# Patient Record
Sex: Female | Born: 1935
Health system: Southern US, Community
[De-identification: ages and names within clinical notes are randomized; demographics above are authoritative.]

## PROBLEM LIST (undated history)

## (undated) DIAGNOSIS — E119 Type 2 diabetes mellitus without complications: Secondary | ICD-10-CM

## (undated) DIAGNOSIS — E785 Hyperlipidemia, unspecified: Secondary | ICD-10-CM

## (undated) DIAGNOSIS — I2510 Atherosclerotic heart disease of native coronary artery without angina pectoris: Secondary | ICD-10-CM

## (undated) DIAGNOSIS — I10 Essential (primary) hypertension: Secondary | ICD-10-CM

## (undated) DIAGNOSIS — G510 Bell's palsy: Secondary | ICD-10-CM

## (undated) HISTORY — DX: Essential (primary) hypertension: I10

## (undated) HISTORY — DX: Type 2 diabetes mellitus without complications: E11.9

## (undated) HISTORY — DX: Bell's palsy: G51.0

## (undated) HISTORY — DX: Atherosclerotic heart disease of native coronary artery without angina pectoris: I25.10

## (undated) HISTORY — DX: Hyperlipidemia, unspecified: E78.5

---

## 1998-12-05 ENCOUNTER — Encounter: Payer: Self-pay | Admitting: Family Medicine

## 1998-12-05 ENCOUNTER — Ambulatory Visit (HOSPITAL_COMMUNITY): Admission: RE | Admit: 1998-12-05 | Discharge: 1998-12-05 | Payer: Self-pay | Admitting: Family Medicine

## 1999-01-16 ENCOUNTER — Encounter: Admission: RE | Admit: 1999-01-16 | Discharge: 1999-01-30 | Payer: Self-pay | Admitting: Orthopedic Surgery

## 1999-10-09 ENCOUNTER — Encounter: Payer: Self-pay | Admitting: Family Medicine

## 1999-10-10 ENCOUNTER — Inpatient Hospital Stay (HOSPITAL_COMMUNITY): Admission: AD | Admit: 1999-10-10 | Discharge: 1999-10-12 | Payer: Self-pay | Admitting: Family Medicine

## 1999-10-10 HISTORY — PX: CARDIAC CATHETERIZATION: SHX172

## 2000-03-19 ENCOUNTER — Encounter: Payer: Self-pay | Admitting: Emergency Medicine

## 2000-03-19 ENCOUNTER — Inpatient Hospital Stay (HOSPITAL_COMMUNITY): Admission: EM | Admit: 2000-03-19 | Discharge: 2000-03-22 | Payer: Self-pay | Admitting: Emergency Medicine

## 2000-03-20 ENCOUNTER — Encounter: Payer: Self-pay | Admitting: Family Medicine

## 2000-08-11 ENCOUNTER — Ambulatory Visit (HOSPITAL_COMMUNITY): Admission: RE | Admit: 2000-08-11 | Discharge: 2000-08-12 | Payer: Self-pay | Admitting: Cardiology

## 2000-08-11 HISTORY — PX: CARDIAC CATHETERIZATION: SHX172

## 2001-05-20 ENCOUNTER — Encounter: Admission: RE | Admit: 2001-05-20 | Discharge: 2001-08-18 | Payer: Self-pay | Admitting: Family Medicine

## 2001-10-11 ENCOUNTER — Encounter: Payer: Self-pay | Admitting: Orthopedic Surgery

## 2001-10-13 ENCOUNTER — Inpatient Hospital Stay (HOSPITAL_COMMUNITY): Admission: RE | Admit: 2001-10-13 | Discharge: 2001-10-15 | Payer: Self-pay | Admitting: Orthopedic Surgery

## 2001-12-14 ENCOUNTER — Encounter: Admission: RE | Admit: 2001-12-14 | Discharge: 2002-03-14 | Payer: Self-pay | Admitting: Orthopedic Surgery

## 2002-04-06 ENCOUNTER — Inpatient Hospital Stay (HOSPITAL_COMMUNITY): Admission: RE | Admit: 2002-04-06 | Discharge: 2002-04-07 | Payer: Self-pay | Admitting: Orthopedic Surgery

## 2005-08-11 ENCOUNTER — Ambulatory Visit (HOSPITAL_COMMUNITY): Admission: RE | Admit: 2005-08-11 | Discharge: 2005-08-11 | Payer: Self-pay | Admitting: Rehabilitation

## 2005-08-18 ENCOUNTER — Inpatient Hospital Stay (HOSPITAL_COMMUNITY): Admission: RE | Admit: 2005-08-18 | Discharge: 2005-08-19 | Payer: Self-pay | Admitting: Cardiology

## 2005-09-17 ENCOUNTER — Encounter: Admission: RE | Admit: 2005-09-17 | Discharge: 2005-12-16 | Payer: Self-pay | Admitting: Family Medicine

## 2005-10-30 ENCOUNTER — Encounter: Admission: RE | Admit: 2005-10-30 | Discharge: 2006-01-28 | Payer: Self-pay | Admitting: Family Medicine

## 2006-06-23 HISTORY — PX: CARDIOVASCULAR STRESS TEST: SHX262

## 2008-06-02 ENCOUNTER — Encounter: Admission: RE | Admit: 2008-06-02 | Discharge: 2008-06-02 | Payer: Self-pay | Admitting: Family Medicine

## 2010-07-12 HISTORY — PX: OTHER SURGICAL HISTORY: SHX169

## 2010-12-05 ENCOUNTER — Ambulatory Visit
Admission: RE | Admit: 2010-12-05 | Discharge: 2010-12-05 | Disposition: A | Discharge status: Home / Self Care | Payer: Medicare HMO | Source: Ambulatory Visit | Attending: Orthopedic Surgery | Admitting: Orthopedic Surgery

## 2010-12-05 ENCOUNTER — Other Ambulatory Visit: Payer: Self-pay | Admitting: Orthopedic Surgery

## 2010-12-05 DIAGNOSIS — R52 Pain, unspecified: Secondary | ICD-10-CM

## 2011-01-31 NOTE — Op Note (Signed)
[place]. [facility]  Patient:    Debra Acosta, Debra Acosta                   MRN: [id number] Proc. Date: 08/11/00 Adm. Date:  [id number] Attending:  Ophelia Shoulder CC:         Geraldo Pitter, M.D.  Cath Lab   Operative Report  PROCEDURE: 1. Percutaneous cutting balloon angioplasty of the second obtuse marginal    branch of the left circumflex coronary artery for in-stent restenosis. 2. Selective coronary angiography by Judkins technique. 3. Retrograde left heart catheterization. 4. Left ventricular angiography.  COMPLICATIONS:  None.  ENTRY SITE:  Right femoral.  DYE USED:  Omnipaque for diagnostic catheterization and Hexabrix for percutaneous coronary intervention.  MEDICATIONS GIVEN:  IV Versed, IV fentanyl for sedation, IV heparin and IV ReoPro.  INDICATIONS:  The patient is a 77 year old African-American female who had an inferior myocardial infarction in January of 2001 and the culprit vessel was found to be a lesion in the circumflex and obtuse marginal branch #2 of the circumflex.  That lesion underwent stenting by Runell Gess, M.D. on October 10, 1999, and the lesion of 99% was reduced to 0% residual after a 3.0 x 15 mm [id number] stent was placed.  The patient began having effort angina several weeks ago and when seen in the [place] office of [facility], was scheduled for this cardiac catheterization.  The patient wished to defer on having it done until after Thanksgiving.  This procedure was performed on an outpatient basis electively.  RESULTS:  The left ventricular pressure was 150/23, central aortic pressure 150/85, mean of 110.  No aortic valve gradient by pullback technique.  ANGIOGRAPHIC RESULTS:  The left main coronary artery showed some irregularities at the ostium, but no significant lesions.  The LAD coursed to the cardiac apex and gave rise to one major diagonal branch. There was a 60%  lesion just before the first diagonal branch arose in the proximal LAD.  This was a short segment and a fairly concentric lesion.  The left circumflex was large and dominant giving rise to a first obtuse marginal branch, a second large obtuse marginal branch, and then a posterior descending branch.  There was a radioopaque stent after the origin of the first obtuse marginal and the stent was involved with the circumflex itself and then a second obtuse marginal branch.  The ostium of the posterior descending was hazy, but was felt not to be significantly stenosed.  There was a distal PDA lesion of approximately 50%.  The right coronary artery was small and nondominant and contained no significant disease.  The left ventricle contained a fairly discrete old inferior infarct in the diaphragmatic wall segment.  It was fairly discrete and well contained and fairly akinetic.  The remaining wall segments moved vigorously.  The ejection fraction estimated at 45%.  No mitral regurgitation is seen.  PERCUTANEOUS CORONARY INTERVENTION:  This intervention was performed with 7 French introducer sheath in the right femoral artery with 7 [place] guide catheter which was a Voda left 4.0.  It provided excellent backup support in the left main coronary artery.  The guide wire used was a 300 cm long, Patriot guide wire.  I tried a 3.0 x 15 mm cutting balloon as my initial device to recannalize the stenotic stent, but it would not cross the lesion.  I therefore tried a 2.5 x 15 mm Maverick balloon which easily crossed  the lesion and was inflated to approximately 8 atmospheres of pressure and this allowed easy passage of the 3.0 cutting balloon.  Several cutting balloon inflations were performed followed by a 3.25 x 10 mm cutting balloon and finally a 3.5 x 15 mm cutting balloon.  I inflated it to 9 atmospheres of pressure and the result looked very good.  The 85% stenosis was reduced to 10% residual and  there was Timi 3 distal flow.  The patient tolerated the procedure very well.  Each time balloon inflation occurred, ST segment changes occurred in leads 1 and 2, but the patients pain was minimal.  IV nitroglycerin was used during the case to manage blood pressures which elevated sometimes up into the 160s and 170s.  FINAL DIAGNOSES: 1. In-stent restenosis of the second obtuse marginal branch of the circumflex    coronary artery. 2. Successful cutting balloon angioplasty of the in-stent restenosis with    reduction of the lesion from 80% to 10%. 3. 60% proximal left anterior descending stenosis and 50% posterior descending    branch stenosis. 4. Mild left ventricular dysfunction with ejection fraction at 45% with an    akinetic inferior wall.  RECOMMENDATIONS:  Plavix for a month.  Beta blocker, aspirin, and statin drug therapy. DD:  08/11/00 TD:  08/11/00 Job: [id number]

## 2011-01-31 NOTE — H&P (Signed)
   NAME:  Debra Acosta, Debra Acosta                   ACCOUNT NO.:  [id number]   MEDICAL RECORD NO.:  [id number]                   PATIENT TYPE:  INP   LOCATION:  5022                                 FACILITY:  [facility]   PHYSICIAN:  Kennieth Rad, M.D.              DATE OF BIRTH:  [birth year]   DATE OF ADMISSION:  04/05/2002  DATE OF DISCHARGE:  04/07/2002                                HISTORY & PHYSICAL   CHIEF COMPLAINT:  Weakness in left shoulder.   HISTORY OF PRESENT ILLNESS:  This is a 77 year old female with rotator cuff  tear of the left shoulder.  The patient had previous repair of the left  rotator cuff several months ago, but has had re-tear of the same rotator  cuff.  The patient has done fairly well with physical therapy, but developed  progressive weakness in the left shoulder.  The patient returned presently  for repair of left rotator cuff with use of Restore mesh gauze.   PAST MEDICAL HISTORY:  Shoulder repair, bilateral tubal ligation,  bunionectomy, history of high blood pressure, and hypercholesterolemia.   ALLERGIES:  PENICILLIN, COZAAR.   MEDICATIONS:  Zocor, Celebrex, Lasix, clonidine, Claritin, Rhinocort,  Proventil, and Diskus puffs one puff daily inhaler, Atacand.   SOCIAL HISTORY:  Habits:  None.   FAMILY HISTORY:  Noncontributory.   REVIEW OF SYSTEMS:  Some shortness of breath.  No urinary or bowel symptoms.  No chest pain.   PHYSICAL EXAMINATION:  VITAL SIGNS:  Temperature 97, pulse 87, respirations  16, blood pressure 193/105.  CBG 162.  Height 5 feet 5 inches, weight 315.  GENERAL:  Extremely obese.  HEENT:  Normocephalic.  Eyes:  Conjunctivae and sclerae clear.  NECK:  Supple.  CHEST:  Clear.  CARDIAC:  S1, S2 regular.  EXTREMITIES:  Left shoulder:  The patient is able to abduct the left  shoulder up to abduction 75 degrees, full extension 85 degrees, __________.  Range of motion limited.   IMPRESSION:  Rotator cuff tear, left shoulder.                                               Kennieth Rad, M.D.    AFC/MEDQ  D:  05/03/2002  T:  05/03/2002  Job:  [id number]

## 2011-01-31 NOTE — Discharge Summary (Signed)
[place]. [facility]  Patient:    Debra Acosta                    MRN: [id number] Adm. Date:  [id number] Disc. Date: [id number] Attending:  Beverely Low Dictator:   Pincus Badder, P.A.                           Discharge Summary  ADMISSION DIAGNOSES: 1. Chest pain, rule out myocardial infarction. 2. Hypertension. 3. Chronic obstructive pulmonary disease. 4. Angina.  DISCHARGE DIAGNOSES: 1. Inferior myocardial infarction. 2. Hypertension. 3. Chronic obstructive pulmonary disease.  CONSULTATION: 1. Dr. Madaline Savage. 2. Dr. Runell Gess.  PROCEDURE:  Coronary catheterization and PTCA with stent placement.  HISTORY OF PRESENT ILLNESS:  This patient is a 77 year old female who reported o the office complaining of chest pain and shortness of breath.  She felt like a heavy chest pain the night before.  This has awakened her from sleep.  She has ad no nausea, no vomiting, no diaphoresis.  She did have some jaw discomfort.  The  pain lasted for several hours, and felt like a heavy pressure.  PAST MEDICAL HISTORY:  She is currently being treated for hypertension, chronic  obstructive pulmonary disease, obesity.  Other medical history reveals she had  tubal ligation and a left bunionectomy in the past.  CURRENT MEDICATIONS: 1. Vioxx. 2. ______ 3. Cardizem. 4. Effexor. 5. Singulair.  She is sometimes noncompliant with her medications.  ALLERGIES:  PENICILLIN AND COZAAR.  PHYSICAL EXAMINATION:  Her blood pressure is 154/90, weight 308 pounds, pulse 88, respirations 18.  LABORATORY DATA:  Showed a sodium of 140, potassium 3.9, chloride 103, CO2 of 29, glucose 111, BUN 16, creatinine 0.9.  Calcium 9.1, total protein 6.7, albumin 3.2, AST 28, ALT 21, ___ 108, total bilirubin 0.7.  CPK elevated at 232, CPK-MB 11.6, relative index 5.0, troponin I of 0.28.  White blood cells 4.4, hemoglobin 13.2, hematocrit 40.3.  PT 15.7, PTT  greater than 200, INR 1.5.  Her electrocardiogram showed T-wave abnormality and also it looks like a previous inferior ischemia with septal infarction, age undetermined.  HOSPITAL COURSE:  We had decided to admit her to the cardiac floor and do a cardiac consultation and start appropriate fluids and medications, and monitor her heart function.  A cardiac consultation done by Dr. Elsie Lincoln revealed a possible myocardial infarction, unstable angina, and it was decided that she should undergo a cardiac catheterization to rule out any stenosis.  The cardiac catheterization did show the left circumflex artery with a 99% stenosis.  She also had involvement in the bifurcation of the second marginal and the PABG/PDA branch.  The left anterior descending coronary artery had a 50%-60% segmental stenosis.  The left main artery had a 20%-30% ostial stenosis.  Her ejection fraction was between 40%-45% with moderate inferobasal hypokinesia.  Therefore she underwent a stent placement which was performed by Dr. Allyson Sabal.  She tolerated the procedure well and following the  procedure had no further chest pain or shortness of breath.  The blood pressure  stabilized and had come down to 104/41.  Electrocardiogram showed a normal sinus rhythm.  Her rehabilitation was started on hospital day one with cardiac rehabilitation.  She initially felt very weak, but as they continued to work with  her, she did start to improve and feel better.  She did very well and had no complaints by  October 11, 1999.  DISPOSITION:  The next day it was decided that she could be discharged home to er family, and followed on an outpatient basis.  CONDITION ON DISCHARGE:  Improved.  DISCHARGE MEDICATIONS: 1. New medications:  Effexor XR 75 mg one q.d. 2. Altace 2.5 mg b.i.d. 3. Vioxx 50 mg q.d. 4. Cardura 8 mg q.d. 5. Plavix 75 mg q.d. for four weeks. 6. Lipitor 10 mg q.d. 7. Enteric-coated aspirin 325 mg q.d. 8.  Singulair 10 mg q.d. 9. Verapamil SR 240 mg q.d.  ACTIVITIES:  Restricted.  No strenuous activity.  No driving until seen in followup.  DIET:  A low-fat diet.  FOLLOWUP:  With Dr. Elsie Lincoln in two weeks, and with Dr. Geraldo Pitter in 10 days. DD:  10/16/99 TD:  10/16/99 Job: [id number]

## 2011-01-31 NOTE — Discharge Summary (Signed)
Debra Acosta, Debra Acosta            ACCOUNT NO.:  [id number]   MEDICAL RECORD NO.:  [id number]          PATIENT TYPE:  INP   LOCATION:  6525                         FACILITY:  [facility]   PHYSICIAN:  Madaline Savage, M.D.DATE OF BIRTH:  [birth year]   DATE OF ADMISSION:  08/18/2005  DATE OF DISCHARGE:  08/19/2005                                 DISCHARGE SUMMARY   DISCHARGE DIAGNOSIS:  1.  Coronary artery disease status post intervention during this admission      with new stent proximal left anterior descending, Cypher stent, and      percutaneous transluminal angiography to mid left circumflex.  2.  Left ventricular  dysfunction with ejection fraction 45%.  3.  Hypertension.  4.  Hyperlipidemia.  5.  Obesity.   HISTORY OF PRESENT ILLNESS:  This is a 77 year old African American female  with prior history of coronary artery disease and intervention in 2001 who  presented to our office in November 2006 with complaints of chest discomfort  and EKG abnormalities with abnormal Cardiolite stress test suggestive of  interval development of worsening disease in the LAD territory.  The patient  was scheduled for cardiac catheterization and was admitted to [facility] to the short stay unit on August 18, 2005.   PROCEDURE PERFORMED:  Cardiac catheterization with intervention performed by  Dr. Elsie Lincoln on August 18, 2005, coronary angiography revealed new lesion in  the LAD with 75% stenosis and lesion in the mid left circumflex of 75%.  Placement of a Cypher stent in the LAD was done with reduction of lesion  from 75% to 0% and, also, an attempt was made to stent the left circumflex,  but he was unable to cross that lesion with the Cypher stent or with a mini-  Vision stent and angioplasty was performed to that lesion with reduction  from 75% to 20%.   HOSPITAL COURSE:  The patient tolerated the procedure well.  The next  morning, she was assessed by Dr. Allyson Sabal and was found to be in  stable  condition for discharge home.   LABORATORY DATA:  CBC revealed hemoglobin 11.6, hematocrit 35, white blood  cell count 4, platelets 163.  BMP showed potassium 4.2, sodium 140, chloride  105, CO2 28, BUN 11, creatinine 1.2, glucose 125.   DISCHARGE INSTRUCTIONS:  No lifting greater than 5 pounds, no driving for  three days, no strenuous activities for three days.  Discharge diet low fat,  low cholesterol diet.   DISCHARGE MEDICATIONS:  Atacand 25 mg daily, Clonidine 0.1 mg b.i.d.,  Vytorin 10/80 mg daily, Actos with Metformin 15/500, 2 p.o. daily, may start  on August 21, 2005, Advair Discus inhaler daily, Plavix 75 mg daily,  aspirin 81 mg daily.   FOLLOW UP:  Dr. Elsie Lincoln will see the patient in our office on September 02, 2005, at 2 p.m.      Debra Acosta, P.A.    ______________________________  Madaline Savage, M.D.    MK/MEDQ  D:  08/19/2005  T:  08/19/2005  Job:  [id number]   cc:   Renaye Rakers, M.D.  Fax: [phone]

## 2011-01-31 NOTE — Op Note (Signed)
[place]. [facility]  Patient:    Debra Acosta, Debra Acosta Visit Number: [id number] MRN: [id number]          Service Type: SUR Location: 5000 [id number] Attending Physician:  Wende Mott Dictated by:   Kennieth Rad, M.D. Proc. Date: 10/13/01 Admit Date:  10/13/2001 Discharge Date: 10/15/2001                             Operative Report  PREOPERATIVE DIAGNOSES: 1. Rotator cuff tear, left shoulder. 2. Impingement left shoulder.  POSTOPERATIVE DIAGNOSES: 1. Rotator cuff tear, left shoulder. 2. Impingement left shoulder.  OPERATION: 1. Arthroscopic acromioplasty and decompression. 2. Open rotator cuff repair with use of two suture anchors.  SURGEON:  Kennieth Rad, M.D.  ANESTHESIA:  General.  DESCRIPTION OF PROCEDURE:  The patient was taken to the operating room after given adequate prior medication and given general anesthesia and intubated. The patient was placed in barber chair position.  The left shoulder was prepped with DuraPrep and draped in sterile manner.  Puncture wound, 1/2 inch, was made posteriorly, Swisher rod was placed from posterior to anterior.  In flow was anterior.  Lateral separate incision was made.  Visualization with the arthroscope revealed tremendously hypertrophic subacromial bursa sac, complete disruption of the rotator cuff.  There was a very large tear and chondromalacia changes on the subacromial space.  With the use of the shaver, synovectomy was done removing the subacromial bursa sac followed by acromioplasty.  A shoulder strap incision was made going through the skin and subcutaneous tissue down to the deltoid and this was reflected distally. Identification of the large tear was made.  Heavy Tycron suture was initially placed more proximal inside the rotator cuff bringing the cuff tendons together and reducing the size of the separation.  Sharp and blunt dissection was done to free and undermine and  free up the cuff tendons.  Once the defect was brought down to a smaller defect, two suture anchors were placed into the greater tuberosity and sutured into anterior and posterior and lateral cuff sections.  Small trough was made at the base with an osteotome and the cuff tendon was buried down into the trough by way of the suture anchors.  Copious irrigation was done with antibiotic solution.  Wound closure was then done with the use of 0 Vicryl for the deltoid, 2-0 for the subcutaneous and skin staples for the skin.  Compressive dressing and immobilizer shoulder brace was applied.  The patient tolerated the procedure quite well and went to the recovery room in stable and satisfactory condition. Dictated by:   Kennieth Rad, M.D. Attending Physician:  Wende Mott DD:  11/06/01 TD:  11/06/01 Job: [id number]

## 2011-01-31 NOTE — H&P (Signed)
[place]. [facility]  Patient:    Debra Acosta                    MRN: [id number] Adm. Date:  [id number] Attending:  Beverely Low Dictator:   Pincus Badder, P.A.                         History and Physical  CHIEF COMPLAINT:  Chest pain, shortness of breath.  HISTORY OF PRESENT ILLNESS:  A 77 year old female who reported shortness of breath and chest pain, felt like a heavy chest pain the night before and it had awoken her from her sleep.  She had no nausea, no vomiting, no diaphoresis, but she also did have some jaw discomfort.  This pain lasted for several hours and mostly felt like a heavy pressure.  ALLERGIES: 1. PENICILLIN. 2. COZAAR.  CURRENT MEDICATIONS: 1. Vioxx 50 mg a day. 2. Covera HS 240 mg a day. 3. Cardizem 4 mg a day. 4. Effexor 75 mg a day. 5. Singulair 10 mg a day.  PAST MEDICAL HISTORY:  She has a history of hypertension.  She has COPD and obesity.  She has a history of a tubal ligation and left bunionectomy.  FAMILY HISTORY:  She has one sister with mild coronary artery disease and a mother had hypertension.  She is deceased now.  SOCIAL HISTORY:  She is married, nine children.  She stopped tobacco about 23 years ago.  No exercise.  REVIEW OF SYSTEMS:  Weight is increased.  She has some chest pain and shortness of breath.  ENT:  Negative.  GI:  Negative.  Neurologic:  She is alert and oriented.  PHYSICAL EXAMINATION:  VITAL SIGNS:  Blood pressure 154/90, weight 308 pounds, pulse 88, respirations 8.  GENERAL:  This is a 77 year old black female.  She is in mild distress at present but in good spirits.  HEENT:  Negative.  NECK:  Supple.  No lymphadenopathy.  Trachea midline.  CHEST:  She has some chest tenderness to the anterior chest cavity.  BREASTS:  Negative.  HEART:  She has a regular rate.  No S3, no S4.  No JVD.  ABDOMEN:  Obese.  Difficult to exam.  GENITOURINARY:   Negative.  EXTREMITIES:  She has full range of motion.  No joint deformities.  SKIN:  Warm and dry.  LABORATORY DATA:  Sodium 140, potassium 3.9, chloride 103, CO2 29, glucose 111, BUN 16, creatinine 0.9, calcium 9.1, total protein 6.7, albumin 3.2, AST 28, ALT 21, ALP 108, total bilirubin 0.7.  CK is elevated at 232, CK-MB 11.6, relative index 5.0, troponin-I 0.28.  White blood cells 4.4, hemoglobin 13.2, hematocrit 40.3. PT 15.7, PTT greater than 200, INR 1.5.  Her electrocardiogram showed a normal sinus rhythm and it also showed a T wave abnormality considered a previous inferior ischemia and a septal infarct, age undetermined.  ASSESSMENT:  Chest pain, rule out myocardial infarction.  Appears to have unstable angina, hypertensive, chronic obstructive pulmonary disease.  PLAN:  The plan is to admit the patient to cardiac floor.  We will do a cardiac  consult, start appropriate fluids and medications, and to monitor her heart function. DD:  10/11/99 TD:  10/11/99 Job: [id number]

## 2011-01-31 NOTE — H&P (Signed)
[place]. [facility]  Patient:    ANGLE, DIRUSSO Visit Number: [id number] MRN: [id number]          Service Type: SUR Location: 5000 [id number] Attending Physician:  Wende Mott Dictated by:   Kennieth Rad, M.D. Admit Date:  10/13/2001 Discharge Date: 10/15/2001                           History and Physical  CHIEF COMPLAINT:  Painful and weak left shoulder.  HISTORY OF PRESENT ILLNESS:  This is a 77 year old female who has been having difficulty with her left shoulder after a fall she had last year.  The patients pain continued to reoccur.  The patient fell in October of last year.  The patient complained of inability to lift the left arm up.  PAST MEDICAL HISTORY:  Left foot surgery and tubal ligation.  Also history of high blood pressure, asthma, hypercholesterolemia, and diabetes mellitus.  ALLERGIES:  PENICILLIN, CORTISONE, and COZAAR.  HABITS:  None.  FAMILY HISTORY:  Noncontributory.  REVIEW OF SYSTEMS:  Episodes of coughing from bronchitis.  No cardiac symptoms, no urinary symptoms.  No bowel symptoms.  PHYSICAL EXAMINATION:  VITAL SIGNS:  Temperature 98.1.  Pulse 82, respirations 18, blood pressure 174/88.  Height 5 feet 5-1/2 inches.  Weight 310 pounds.  HEENT:  Normocephalic, atraumatic.  Sclera clear.  NECK:  Supple.  CHEST:  Clear.  CARDIAC:  S1, S2 regular.  EXTREMITIES:  Left shoulder tender anterior and laterally.  The patient has very minimal ability to abduct the left arm.  Subacromial crepitus.  Pain on rotation and abduction.  IMPRESSION: 1. Rotator cuff tear left shoulder. 2. High blood pressure. 3. Diabetes mellitus. 4. Chronic obstructive pulmonary disease. 5. Hypercholesterolemia. Dictated by:   Kennieth Rad, M.D. Attending Physician:  Wende Mott DD:  11/06/01 TD:  11/06/01 Job: [id number]

## 2011-01-31 NOTE — Discharge Summary (Signed)
[place]. [facility]  Patient:    Debra Acosta, Debra Acosta                   MRN: [id number] Adm. Date:  [id number] Disc. Date: 03/22/00 Attending:  Beverely Low                           Discharge Summary  DATE OF BIRTH:  [birth year]  CONSULTING PHYSICIANS:  None.  ADMISSION DIAGNOSES: 1. Small bowel ileus. 2. Urinary tract infection with abdominal pain.  PRIMARY PROCEDURE:  CT scan of the chest and abdomen.  Results were that both were essentially negative.  HOSPITAL COURSE:  Briefly, this patient was admitted secondary to unrelenting abdominal pain, nausea, vomiting and a urinary tract infection.  Chest x-ray upon admission showed some findings suggestive of a probable dissecting abdominal aortic aneurysm; therefore, CT scan of abdomen and chest were obtained which were both negative.  The patient was admitted and placed on I.V. fluids and given Morphine for pain and ______ in the emergency department as well as Phenergan q.4h. p.r.n.  She was made n.p.o. upon completion of her CT of the chest and abdomen.  The patients diet was advanced.  She tolerated it well and did have 2 or 3 bowel movements and was discharged to home in good condition on hospital day #3 with no pain.  All laboratory data remained normal except for UA which showed many bacteria and culture was positive for E. coli.  She was placed on Cipro at the time of admission and did receive 500 mg b.i.d. for 3 full days.  She also was resumed on her home medicine.  The patient was discharged to home in good condition with instructions to resume her home medicines and followup with Dr. Parke Simmers as scheduled on March 30, 2000 and to notify our office should there be any nausea, vomiting, abdominal pain or recurrence of any of the problems which caused her to be admitted in the first place. DD:  03/22/00 TD:  03/22/00 Job: [id number]

## 2011-01-31 NOTE — Discharge Summary (Signed)
[place]. [facility]  Patient:    Debra Acosta, Debra Acosta                   MRN: [id number] Adm. Date:  [id number] Disc. Date: 08/12/00 Attending:  Ophelia Shoulder Dictator:   Halford Decamp. Delanna Ahmadi, R.N., N.P. CC:         Geraldo Pitter, M.D.   Discharge Summary  HOSPITAL COURSE:  Mr. Hoon is a 77 year old female, a patient of Dr. Deanna Artis. Gambles, who was admitted for a coronary angiography secondary to positive Cardiolyte.  Her cardiac catheterization revealed that she had an 80% in-stent restenosis of her circumflex stent.  She then underwent a cutting balloon PTCA.  She received IV ReoPro; however, her platelets dropped down to 83, thus the ReoPro was stopped early.  On the morning of August 12, 2000, she was seen by Dr. Elsie Lincoln.  Her platelet count was back up to 130.  Her CBC hemoglobin was 11.6, hematocrit 34.2.  Her potassium was slightly low at 3.4.  thus she was given K-Dur 40 mEq prior to being discharged.  Her right groin was without bruise or hematoma.  Her lungs were clear.  Her telemetry showed a sinus rhythm.  DISPOSITION:  She was discharged home in stable condition.  LABORATORY DATA:  CPK-MB 180/2.3 post-procedure.  Other labs as stated previously.  DISCHARGE MEDICATIONS: 1. Enteric-coated aspirin 325 mg one q.d. 2. Lasix 40 mg one q.d. 3. Cardura 8 mg one q.d. 4. Singulair 10 mg one q.d. 5. Clonidine 0.1 mg b.i.d. 6. Avapro 150 mg one q.d. 7. Protonix 40 mg one q.d. 8. Plavix 75 mg one q.d. x 1 month.  INSTRUCTIONS:  She is to do no strenuous activity, no driving, no house cleaning x 4 days.  DIET:  She is to be on a low-fat diet.  FOLLOWUP:  She will follow up with Dr. Elsie Lincoln in [place] on August 18, 2000, at 3 p.m.  DISCHARGE DIAGNOSES: 1. Unstable angina with positive Cardiolyte, now status post cardiac    catheterization, revealing 80% in-stent restenosis of her circumflex    coronary artery stent, with  subsequent cutting balloon percutaneous    transluminal coronary angioplasty of her circumflex artery stent,    reduced from 80% to 10%. 2. Prior arteriosclerotic cardiovascular disease. 3. Hypokalemia. 4. Hypertension. 5. History of bronchitis and chronic obstructive pulmonary disease. DD:  08/12/00 TD:  08/12/00 Job: [id number]

## 2011-01-31 NOTE — Op Note (Signed)
   NAME:  Debra Acosta, Debra Acosta                  ACCOUNT NO.:  [id number]   MEDICAL RECORD NO.:  [id number]                   PATIENT TYPE:  INP   LOCATION:  5022                                 FACILITY:  [facility]   PHYSICIAN:  Kennieth Rad, M.D.              DATE OF BIRTH:  Jan 29, 1931   DATE OF PROCEDURE:  04/05/2002  DATE OF DISCHARGE:  04/07/2002                                 OPERATIVE REPORT   PREOPERATIVE DIAGNOSES:  Rotator cuff tear left shoulder.   POSTOPERATIVE DIAGNOSES:  Rotator cuff tear left shoulder with deficient  rotator cuff tendon.   ANESTHESIA:  General.   PROCEDURE:  Repair of left rotator cuff with Restore mesh gauze and use of  two anchors.  The patient was taken to the operating room after being given  preoperative medication, given general anesthesia, and intubated.  The  patient was placed in a barber chair position.  Left shoulder was prepped  with Duraprep and taped in a sterile manner.  Incision was made over the  left shoulder going through the old previous scar.  Inspection of the  ________ getting down through the skin and subcutaneous tissue and  reflecting the deltoid.  Inspection revealed 90% of the previous tear repair  was intact but the cuff tendon itself was deficient and subsequently a  Restore mesh gauze was used and sutured down into the rotator cuff.  A small  section was torn away.  Two anchor sutures were used to reapproximate and  then this was incorporated also into the mesh gauze with the hopes of  increasing the thickness and thereby strength of the total rotator cuff.  Copious irrigation with antibiotic solution was done.  Wound closure was  then done with 0 Vicryl including deltoid and 2-0 for the subcutaneous.  Skin staples of the skin.  The patient was placed in an airplane splint in  65 degrees of abduction to again protect and repair.  The patient tolerated  procedure quite well.  Went to recovery room in stable and  satisfactory  condition.                                               Kennieth Rad, M.D.    AFC/MEDQ  D:  05/03/2002  T:  05/03/2002  Job:  [id number]

## 2011-01-31 NOTE — Discharge Summary (Signed)
[place]. [facility]  Patient:    Debra Acosta, Debra Acosta Visit Number: [id number] MRN: [id number]          Service Type: SUR Location: 5000 [id number] [id number] Attending Physician:  Wende Mott Dictated by:   Kennieth Rad, M.D. Admit Date:  10/13/2001 Discharge Date: 10/15/2001                             Discharge Summary  ADMITTING DIAGNOSES: 1. Impingement left shoulder. 2. Rotator cuff tear left shoulder. 3. Diabetes mellitus. 4. High blood pressure. 5. Chronic obstructive pulmonary disease. 6. Hypercholesterolemia.  DISCHARGE DIAGNOSES: 1. Impingement left shoulder. 2. Rotator cuff tear left shoulder. 3. Diabetes mellitus. 4. High blood pressure. 5. Chronic obstructive pulmonary disease. 6. Hypercholesterolemia.  COMPLICATIONS:  None.  INFECTIONS:  None.  OPERATION: 1. Arthroscopy, left shoulder. 2. Acromioplasty and rotator cuff repair of left shoulder.  PERTINENT HISTORY:  This is a 77 year old who had fallen last October and sustained injury to her left shoulder.  The patient continued to have pain and inability to lift the left arm.  PERTINENT PHYSICAL:  That of the left shoulder.  Markedly tender anterior and laterally.  Very minimal ability to abduct the left arm.  Subacromial crepitus with tenderness to palpation.  Neurovascular status is intact.  HOSPITAL COURSE:  The patient underwent arthroscopy and open repair of rotator cuff, acromioplasty and decompression.  The patient tolerated the procedure quite well.  Postoperative course fairly benign.  There was some difficulty getting a brace to fit her because of her body size, but we were able to use large combined sling-and-swathe.  The patients pain was brought under control and the patient was able to be discharged to return to the office in one week.  Discharged on Percocet one q.4h. p.r.n. for pain, ice packs, continued use of the brace and to return to the office in one  week.  The patient was discharged in stable and satisfactory condition. Dictated by:   Kennieth Rad, M.D. Attending Physician:  Wende Mott DD:  11/06/01 TD:  11/06/01 Job: [id number]

## 2012-09-13 ENCOUNTER — Other Ambulatory Visit (HOSPITAL_COMMUNITY): Payer: Self-pay | Admitting: Family Medicine

## 2012-09-13 ENCOUNTER — Ambulatory Visit (HOSPITAL_COMMUNITY)
Admission: RE | Admit: 2012-09-13 | Discharge: 2012-09-13 | Disposition: A | Discharge status: Home / Self Care | Payer: Medicare Other | Source: Ambulatory Visit | Attending: Family Medicine | Admitting: Family Medicine

## 2012-09-13 DIAGNOSIS — M7989 Other specified soft tissue disorders: Secondary | ICD-10-CM

## 2012-09-13 DIAGNOSIS — R600 Localized edema: Secondary | ICD-10-CM

## 2012-09-13 NOTE — Progress Notes (Signed)
*  Preliminary Results* Left lower extremity venous duplex completed. Left lower extremity is negative for deep vein thrombosis. Unable to visualize left posterior tibial and peroneal veins due to swelling, therefore deep vein thrombosis cannot be excluded in these vessels. No evidence of left Baker's cyst. Preliminary results discussed with Dr.Bland.  09/13/2012 5:24 PM Gertie Fey, RDMS, RDCS

## 2013-06-24 ENCOUNTER — Encounter: Payer: Self-pay | Admitting: Internal Medicine

## 2013-06-24 ENCOUNTER — Ambulatory Visit (INDEPENDENT_AMBULATORY_CARE_PROVIDER_SITE_OTHER): Payer: Medicare Other | Admitting: Internal Medicine

## 2013-06-24 VITALS — BP 142/70 | HR 71 | Ht 65.5 in | Wt 286.7 lb

## 2013-06-24 DIAGNOSIS — I2510 Atherosclerotic heart disease of native coronary artery without angina pectoris: Secondary | ICD-10-CM

## 2013-06-24 DIAGNOSIS — R079 Chest pain, unspecified: Secondary | ICD-10-CM

## 2013-06-24 NOTE — Patient Instructions (Signed)
Your physician has requested that you have a lexiscan myoview. For further information please visit [email or web address]. Please follow instruction sheet, as given.  Your physician recommends that you schedule a follow-up appointment after your stress test.   

## 2013-06-26 ENCOUNTER — Encounter: Payer: Self-pay | Admitting: Internal Medicine

## 2013-06-26 NOTE — Progress Notes (Signed)
OFFICE NOTE  Chief Complaint:  Chest pain, routine follow-up  Primary Care Physician: Geraldo Pitter, MD  HPI:  Debra Acosta  is a morbidly obese 79 year old female with a history of coronary disease. She had a Cypher stent placed in the LAD in 2006 and stenting of the mid circumflex and OM2 in 2001. She also has diabetes, dyslipidemia and "asthma." She reports doing fairly well, although she occasionally does get short of breath. She eats a very poor diet, including fast foods, potato chips, pretty much anything she can get her hands on. Recently she has been helping her 65 year old son move into his own apartment for the first time and had an episode of shortness of breath and chest pressure walking up stairs. She took a nitroglycerin which she reported gave her quick relief, however, she had no headache, burning under the tongue, and the medication was over 26 years old. She did come to the office for a refill of her nitroglycerin prescription. I doubt this is an episode of angina; it most likely was related to her morbid obesity and deconditioning, as well as extreme exertion. Unfortunately she has not been able to lose weight.  As I last saw her she's had a couple more episodes of chest pain for which he takes nitroglycerin. This is mostly episodes happen when she is reaching for something or going up and downstairs. They're likely to be associated with shortness of breath. Nitroglycerin does tend to relieve them quite promptly. I wonder if perhaps these are episodes of hypertension or angina. Her last stress test was back in 2007 an echocardiogram was in 2011 which showed a preserved EF of greater than 55% but borderline inferior wall hypokinesis. She reports her chest pain is midsternal, does not radiate to her jaw and arm and is associated with exertion relieved by rest. She rates it as a 4-5/10 and resolves completely with nitroglycerin and rest.   PMHx:  Past Medical History    Diagnosis Date  . Hypertension   . Diabetes mellitus without complication   . Coronary artery disease     S/P PCI of the LAD, OM2, and circumflex  . Bell's palsy     Left facial droop  . Dyslipidemia     Past Surgical History  Procedure Laterality Date  . Cardiac catheterization  10/10/1999    L Circumflex 99% stenosis stented with a 3.0x26mm S670 stent, resulting in reduction from 99% to 0% residual  . Cardiac catheterization  08/11/2000    L Circumflex 85% in-stent restenosis, intervention done with a 3.5x65mm cutting balloon inflated at 9 atm, resulting in reduction of 85% restenosis to 10% w/TIMI 3 flow  . Cardiovascular stress test  06/23/2006    Dobutamine. No ECG changes, essentially a low risk scan.  . 2d echocardiogram  07/12/2010    EF >55%, normal-mild    FAMHx:  Family History  Problem Relation Age of Onset  . Heart disease Sister   . Stroke Brother     SOCHx:   reports that she quit smoking about 37 years ago. She has never used smokeless tobacco. She reports that she does not drink alcohol or use illicit drugs.  ALLERGIES:  Allergies  Allergen Reactions  . Penicillins     ROS: A comprehensive review of systems was negative except for: Respiratory: positive for dyspnea on exertion Cardiovascular: positive for exertional chest pressure/discomfort  HOME MEDS: Current Outpatient Prescriptions  Medication Sig Dispense Refill  . aspirin EC 81 MG  tablet Take 81 mg by mouth daily.      . brimonidine (ALPHAGAN P) 0.1 % SOLN Place 1 drop into both eyes 3 (three) times daily.      . clopidogrel (PLAVIX) 75 MG tablet Take 75 mg by mouth daily.      . dorzolamide (TRUSOPT) 2 % ophthalmic solution Place 1 drop into both eyes 3 (three) times daily.      . Fluticasone-Salmeterol (ADVAIR) 250-50 MCG/DOSE AEPB Inhale 1 puff into the lungs daily.      . metFORMIN (GLUCOPHAGE) 500 MG tablet Take 1,000 mg by mouth at bedtime.      . niacin (NIASPAN) 1000 MG CR tablet Take  1,000 mg by mouth at bedtime.      . nitroGLYCERIN (NITROSTAT) 0.4 MG SL tablet Place 0.4 mg under the tongue every 5 (five) minutes as needed for chest pain.      . rosuvastatin (CRESTOR) 20 MG tablet Take 20 mg by mouth at bedtime.       No current facility-administered medications for this visit.    LABS/IMAGING: No results found for this or any previous visit (from the past 48 hour(s)). No results found.  VITALS: BP 142/70  Pulse 71  Ht 5' 5.5" (1.664 m)  Wt 286 lb 11.2 oz (130.046 kg)  BMI 46.97 kg/m2  EXAM: General appearance: alert and no distress HEENT: PERRLA, no JVD, no carotid bruit PULM: lungs clear to auscultation bilaterally CV: regular rate and rhythm, S1, S2 normal, no murmur, click, rub or gallop, 2+ pulses GI: soft, non-tender; bowel sounds normal; no masses,  no organomegaly MUSCULOSKELETAL: extremities normal, atraumatic, no cyanosis or edema, no chest pain on palpation Skin: Skin color, texture, turgor normal. No rashes or lesions Neurologic: Mild left facial droop, unchanged Psych: Mood, affect normal, pleasant  EKG: Sinus rhythm with occasional PVCs at 71, unchanged  ASSESSMENT: 1. Unstable angina-responsive to nitroglycerin 2. Morbid obesity 3. Diabetes type 2 4. Dyslipidemia 5. History of coronary artery disease with PCI in the LAD colon to a circumflex in the early 2000 6. History of left facial droop, thought to be Bell's Palsy - versus possible stroke  PLAN: 1.   Mrs. dose is been having some chest pain on and off for a good amount of time but has been using nitroglycerin more frequently. This is consistent more with unstable angina. Her blood pressure is well controlled and her weight has been fairly stable. She has been getting short of breath with exertion which could also indicate ischemia. I would like for her to undergo a nuclear stress test as her last was in 2007. I plan to see her back after that study to review the results. Again a  pleasure seeing Debra Acosta, if I can be of further assistance as always please don't hesitate to contact me.  Chrystie Nose, MD, [facility] Attending Cardiologist [facility]  HILTY,Kenneth C 06/26/2013, 7:11 PM

## 2013-06-29 ENCOUNTER — Ambulatory Visit (HOSPITAL_COMMUNITY)
Admission: RE | Admit: 2013-06-29 | Discharge: 2013-06-29 | Disposition: A | Discharge status: Home / Self Care | Payer: Medicare Other | Source: Ambulatory Visit | Attending: Cardiology | Admitting: Cardiology

## 2013-06-29 DIAGNOSIS — R079 Chest pain, unspecified: Secondary | ICD-10-CM

## 2013-06-29 MED ORDER — TECHNETIUM TC 99M SESTAMIBI GENERIC - CARDIOLITE
30.8000 | Freq: Once | INTRAVENOUS | Status: AC | PRN
  Administered 2013-06-29: 30.8 via INTRAVENOUS

## 2013-06-29 MED ORDER — REGADENOSON 0.4 MG/5ML IV SOLN
0.4000 mg | Freq: Once | INTRAVENOUS | Status: AC
  Administered 2013-06-29: 0.4 mg via INTRAVENOUS

## 2013-06-29 MED ORDER — TECHNETIUM TC 99M SESTAMIBI GENERIC - CARDIOLITE
10.6000 | Freq: Once | INTRAVENOUS | Status: AC | PRN
  Administered 2013-06-29: 11 via INTRAVENOUS

## 2013-06-29 NOTE — Procedures (Addendum)
[facility] [address] [name] [address] [place] [address] [phone]  Cardiology Nuclear Med Study  Debra Acosta is a 79 y.o. female     MRN : [id number]     DOB: August 09, 1931  Procedure Date: 06/29/2013  Nuclear Med Background Indication for Stress Test:  Stent Patency History:  COPD and CAD;MI--09/1999;STENT/PTCA--08/09/2000 AND 08/11/2000 Cardiac Risk Factors: Family History - CAD, History of Smoking, Hypertension, NIDDM and Obesity  Symptoms:  Chest Pain, DOE and Palpitations   Nuclear Pre-Procedure Caffeine/Decaff Intake:  1:00am NPO After: 11 AM   IV Site: R Wrist  IV 0.9% NS with Angio Cath:  22g  Chest Size (in):  N/A IV Started by: Emmit Pomfret, RN  Height: 5\' 5"  (1.651 m)  Cup Size: DD  BMI:  Body mass index is 47.59 kg/(m^2). Weight:  286 lb (129.729 kg)   Tech Comments:  N/A    Nuclear Med Study 1 or 2 day study: 1 day  Stress Test Type:  Lexiscan  Order Authorizing Provider:  Zoila Shutter, MD   Resting Radionuclide: Technetium 62m Sestamibi  Resting Radionuclide Dose: 10.6 mCi   Stress Radionuclide:  Technetium 35m Sestamibi  Stress Radionuclide Dose: 30.8 mCi           Stress Protocol Rest HR: 75 Stress HR: 77  Rest BP: 142/82 Stress BP: 151/78  Exercise Time (min): n/a METS: n/a   Predicted Max HR: 140 bpm % Max HR: 56.43 bpm Rate Pressure Product: 21308  Dose of Adenosine (mg):  n/a Dose of Lexiscan: 0.4 mg  Dose of Atropine (mg): n/a Dose of Dobutamine: n/a mcg/kg/min (at max HR)  Stress Test Technologist: Esperanza Sheets, CCT Nuclear Technologist: Gonzella Lex, CNMT   Rest Procedure:  Myocardial perfusion imaging was performed at rest 45 minutes following the intravenous administration of Technetium 77m Sestamibi. Stress Procedure:  The patient received IV Lexiscan 0.4 mg over 15-seconds.  Technetium 22m Sestamibi injected at 30-seconds.  There were no significant changes with Lexiscan.   Quantitative spect images were obtained after a 45 minute delay.  Transient Ischemic Dilatation (Normal <1.22):  1.04 Lung/Heart Ratio (Normal <0.45):  0.28 QGS EDV:  n/a ml QGS ESV:  n/a ml LV Ejection Fraction: study not gated  Signed by     Rest ECG: NSR - PRWP and occasional PVC  Stress ECG: No significant change from baseline ECG; occasional PVC's.  QPS Raw Data Images:  Normal; no motion artifact; normal heart/lung ratio. Stress Images:  Increased splanchnic uptake. Small in size, moderate in intensity mid to basal inferior defect. Rest Images:  Inferior defect is slightly greater on resting imaging with additional inferior attenuation. Subtraction (SDS):  No evidence of ischemia.  Impression Exercise Capacity:  Lexiscan with no exercise. BP Response:  Normal blood pressure response. Clinical Symptoms:  Mild shortness of breath ECG Impression:  No significant ST segment change suggestive of ischemia. Comparison with Prior Nuclear Study: In comparison to 2007, inferior defect may be slightly more prominent; attenuation is greater.  Overall Impression:  Low risk stress nuclear study demonstrating a small region of inferior scar with additional diaphragmatic attenuation without significant ischemia..  LV Wall Motion:  Not gated secondary to ectopy.   Lennette Bihari, MD  06/29/2013 6:13 PM

## 2013-07-06 ENCOUNTER — Ambulatory Visit: Payer: Medicare Other | Admitting: Cardiovascular Disease

## 2013-07-07 ENCOUNTER — Ambulatory Visit (INDEPENDENT_AMBULATORY_CARE_PROVIDER_SITE_OTHER): Payer: Medicare Other | Admitting: Internal Medicine

## 2013-07-07 ENCOUNTER — Encounter: Payer: Self-pay | Admitting: Internal Medicine

## 2013-07-07 DIAGNOSIS — E785 Hyperlipidemia, unspecified: Secondary | ICD-10-CM

## 2013-07-07 DIAGNOSIS — R079 Chest pain, unspecified: Secondary | ICD-10-CM | POA: Insufficient documentation

## 2013-07-07 DIAGNOSIS — R0609 Other forms of dyspnea: Secondary | ICD-10-CM

## 2013-07-07 DIAGNOSIS — E119 Type 2 diabetes mellitus without complications: Secondary | ICD-10-CM

## 2013-07-07 DIAGNOSIS — I2510 Atherosclerotic heart disease of native coronary artery without angina pectoris: Secondary | ICD-10-CM

## 2013-07-07 DIAGNOSIS — G510 Bell's palsy: Secondary | ICD-10-CM | POA: Insufficient documentation

## 2013-07-07 NOTE — Patient Instructions (Signed)
Your physician wants you to follow-up in:  6 months. You will receive a reminder letter in the mail two months in advance. If you don't receive a letter, please call our office to schedule the follow-up appointment.   

## 2013-07-07 NOTE — Progress Notes (Signed)
OFFICE NOTE  Chief Complaint:  Chest pain, routine follow-up  Primary Care Physician: Geraldo Pitter, MD  HPI:  Debra Acosta  is a morbidly obese 79 year old female with a history of coronary disease. She had a Cypher stent placed in the LAD in 2006 and stenting of the mid circumflex and OM2 in 2001. She also has diabetes, dyslipidemia and "asthma." She reports doing fairly well, although she occasionally does get short of breath. She eats a very poor diet, including fast foods, potato chips, pretty much anything she can get her hands on. Recently she has been helping her 73 year old son move into his own apartment for the first time and had an episode of shortness of breath and chest pressure walking up stairs. She took a nitroglycerin which she reported gave her quick relief, however, she had no headache, burning under the tongue, and the medication was over 66 years old. She did come to the office for a refill of her nitroglycerin prescription. I doubt this is an episode of angina; it most likely was related to her morbid obesity and deconditioning, as well as extreme exertion. Unfortunately she has not been able to lose weight.  As I last saw her she's had a couple more episodes of chest pain for which he takes nitroglycerin. This is mostly episodes happen when she is reaching for something or going up and downstairs. They're likely to be associated with shortness of breath. Nitroglycerin does tend to relieve them quite promptly. I wonder if perhaps these are episodes of hypertension or angina. Her last stress test was back in 2007 an echocardiogram was in 2011 which showed a preserved EF of greater than 55% but borderline inferior wall hypokinesis. She reports her chest pain is midsternal, does not radiate to her jaw and arm and is associated with exertion relieved by rest. She rates it as a 4-5/10 and resolves completely with nitroglycerin and rest.  He underwent a nuclear stress test on  06/29/2013. This showed a fixed inferior defect but it was non-gated. This could represent scar or bowel attenuation artifact. She did have attenuation artifact previously. Either way there is no evidence for ischemia.  Since her last visit she reports her chest pain has been less significant and she's been using very little if any nitroglycerin. Her shortness of breath he is stable and at her baseline. Overall she feels like she is doing very well.  PMHx:  Past Medical History  Diagnosis Date  . Hypertension   . Diabetes mellitus without complication   . Coronary artery disease     S/P PCI of the LAD, OM2, and circumflex  . Bell's palsy     Left facial droop  . Dyslipidemia     Past Surgical History  Procedure Laterality Date  . Cardiac catheterization  10/10/1999    L Circumflex 99% stenosis stented with a 3.0x47mm S670 stent, resulting in reduction from 99% to 0% residual  . Cardiac catheterization  08/11/2000    L Circumflex 85% in-stent restenosis, intervention done with a 3.5x58mm cutting balloon inflated at 9 atm, resulting in reduction of 85% restenosis to 10% w/TIMI 3 flow  . Cardiovascular stress test  06/23/2006    Dobutamine. No ECG changes, essentially a low risk scan.  . 2d echocardiogram  07/12/2010    EF >55%, normal-mild    FAMHx:  Family History  Problem Relation Age of Onset  . Heart disease Sister   . Stroke Brother  SOCHx:   reports that she quit smoking about 37 years ago. She has never used smokeless tobacco. She reports that she does not drink alcohol or use illicit drugs.  ALLERGIES:  Allergies  Allergen Reactions  . Penicillins     ROS: A comprehensive review of systems was negative except for: Respiratory: positive for dyspnea on exertion  HOME MEDS: Current Outpatient Prescriptions  Medication Sig Dispense Refill  . aspirin EC 81 MG tablet Take 81 mg by mouth daily.      . brimonidine (ALPHAGAN P) 0.1 % SOLN Place 1 drop into both eyes 3  (three) times daily.      . budesonide-formoterol (SYMBICORT) 160-4.5 MCG/ACT inhaler Inhale 2 puffs into the lungs 2 (two) times daily as needed.      . dorzolamide (TRUSOPT) 2 % ophthalmic solution Place 1 drop into both eyes 3 (three) times daily.      . Fluticasone-Salmeterol (ADVAIR) 250-50 MCG/DOSE AEPB Inhale 1 puff into the lungs daily.      Marland Kitchen latanoprost (XALATAN) 0.005 % ophthalmic solution Place 1 drop into both eyes at bedtime.      . metFORMIN (GLUCOPHAGE) 500 MG tablet Take 1,000 mg by mouth at bedtime.      . niacin (NIASPAN) 1000 MG CR tablet Take 1,000 mg by mouth at bedtime.      . nitroGLYCERIN (NITROSTAT) 0.4 MG SL tablet Place 0.4 mg under the tongue every 5 (five) minutes as needed for chest pain.      . rosuvastatin (CRESTOR) 20 MG tablet Take 20 mg by mouth at bedtime.      . clopidogrel (PLAVIX) 75 MG tablet Take 75 mg by mouth daily.       No current facility-administered medications for this visit.    LABS/IMAGING: No results found for this or any previous visit (from the past 48 hour(s)). No results found.  VITALS: BP 150/70  Pulse 76  Ht 5' 5.5" (1.664 m)  Wt 289 lb 3.2 oz (131.18 kg)  BMI 47.38 kg/m2  EXAM: Deferred  EKG: deferred  ASSESSMENT: 1. Unstable angina- nuclear stress test in 06-2013 negative for ischemia 2. Morbid obesity 3. Diabetes type 2 4. Dyslipidemia 5. History of coronary artery disease with PCI in the LAD colon to a circumflex in the early 2000 6. History of left facial droop, thought to be Bell's Palsy - versus possible stroke  PLAN: 1.   Mrs. Debra Acosta reports some improvement in her chest pain and less frequent use of nitroglycerin. I wonder if this is possibly due to hypertension or other reasons. Her stress test was negative for ischemia which is reassuring. I asked her to keep track of how much nitroglycerin she may be using as she could be a candidate for long-acting nitrate. It could be possible that her pain is due to  diastolic dysfunction or that the nitroglycerin is relieving possibly smooth muscle pain associated with esophageal spasm or other disorders. Plan to see her back in 6 months or sooner as necessary.  Chrystie Nose, MD, [facility] Attending Cardiologist [facility]  Colonel Krauser C 07/07/2013, 2:01 PM

## 2014-11-27 ENCOUNTER — Other Ambulatory Visit (INDEPENDENT_AMBULATORY_CARE_PROVIDER_SITE_OTHER): Payer: Self-pay | Admitting: Surgery

## 2014-11-27 ENCOUNTER — Ambulatory Visit (INDEPENDENT_AMBULATORY_CARE_PROVIDER_SITE_OTHER): Payer: Self-pay | Admitting: Surgery

## 2014-12-04 ENCOUNTER — Other Ambulatory Visit: Payer: Self-pay | Admitting: Surgery

## 2014-12-04 ENCOUNTER — Ambulatory Visit: Payer: Self-pay | Admitting: Surgery

## 2014-12-04 DIAGNOSIS — I878 Other specified disorders of veins: Secondary | ICD-10-CM

## 2015-09-21 DIAGNOSIS — I10 Essential (primary) hypertension: Secondary | ICD-10-CM | POA: Diagnosis not present

## 2015-09-21 DIAGNOSIS — Z6841 Body Mass Index (BMI) 40.0 and over, adult: Secondary | ICD-10-CM | POA: Diagnosis not present

## 2015-09-21 DIAGNOSIS — E119 Type 2 diabetes mellitus without complications: Secondary | ICD-10-CM | POA: Diagnosis not present

## 2015-09-21 DIAGNOSIS — J441 Chronic obstructive pulmonary disease with (acute) exacerbation: Secondary | ICD-10-CM | POA: Diagnosis not present

## 2015-11-27 DIAGNOSIS — H401131 Primary open-angle glaucoma, bilateral, mild stage: Secondary | ICD-10-CM | POA: Diagnosis not present

## 2015-11-27 DIAGNOSIS — Z961 Presence of intraocular lens: Secondary | ICD-10-CM | POA: Diagnosis not present

## 2016-01-01 DIAGNOSIS — J441 Chronic obstructive pulmonary disease with (acute) exacerbation: Secondary | ICD-10-CM | POA: Diagnosis not present

## 2016-01-01 DIAGNOSIS — E119 Type 2 diabetes mellitus without complications: Secondary | ICD-10-CM | POA: Diagnosis not present

## 2016-01-01 DIAGNOSIS — K5900 Constipation, unspecified: Secondary | ICD-10-CM | POA: Diagnosis not present

## 2016-01-01 DIAGNOSIS — I10 Essential (primary) hypertension: Secondary | ICD-10-CM | POA: Diagnosis not present

## 2016-03-28 DIAGNOSIS — E119 Type 2 diabetes mellitus without complications: Secondary | ICD-10-CM | POA: Diagnosis not present

## 2016-03-28 DIAGNOSIS — Z961 Presence of intraocular lens: Secondary | ICD-10-CM | POA: Diagnosis not present

## 2016-03-28 DIAGNOSIS — H401132 Primary open-angle glaucoma, bilateral, moderate stage: Secondary | ICD-10-CM | POA: Diagnosis not present

## 2016-04-15 ENCOUNTER — Encounter (HOSPITAL_COMMUNITY): Payer: Self-pay | Admitting: Emergency Medicine

## 2016-04-15 ENCOUNTER — Emergency Department (HOSPITAL_COMMUNITY): Payer: PPO

## 2016-04-15 ENCOUNTER — Emergency Department (HOSPITAL_COMMUNITY)
Admission: EM | Admit: 2016-04-15 | Discharge: 2016-04-15 | Disposition: A | Discharge status: Home / Self Care | Payer: PPO | Attending: Emergency Medicine | Admitting: Emergency Medicine

## 2016-04-15 DIAGNOSIS — E119 Type 2 diabetes mellitus without complications: Secondary | ICD-10-CM | POA: Insufficient documentation

## 2016-04-15 DIAGNOSIS — G8929 Other chronic pain: Secondary | ICD-10-CM | POA: Diagnosis not present

## 2016-04-15 DIAGNOSIS — Z87891 Personal history of nicotine dependence: Secondary | ICD-10-CM | POA: Insufficient documentation

## 2016-04-15 DIAGNOSIS — I10 Essential (primary) hypertension: Secondary | ICD-10-CM | POA: Diagnosis not present

## 2016-04-15 DIAGNOSIS — M25551 Pain in right hip: Secondary | ICD-10-CM | POA: Insufficient documentation

## 2016-04-15 DIAGNOSIS — Z7984 Long term (current) use of oral hypoglycemic drugs: Secondary | ICD-10-CM | POA: Insufficient documentation

## 2016-04-15 DIAGNOSIS — M25561 Pain in right knee: Secondary | ICD-10-CM | POA: Diagnosis not present

## 2016-04-15 DIAGNOSIS — Z7982 Long term (current) use of aspirin: Secondary | ICD-10-CM | POA: Insufficient documentation

## 2016-04-15 DIAGNOSIS — Z7902 Long term (current) use of antithrombotics/antiplatelets: Secondary | ICD-10-CM | POA: Insufficient documentation

## 2016-04-15 DIAGNOSIS — I2510 Atherosclerotic heart disease of native coronary artery without angina pectoris: Secondary | ICD-10-CM | POA: Diagnosis not present

## 2016-04-15 MED ORDER — OXYCODONE-ACETAMINOPHEN 5-325 MG PO TABS: 1.0000 | ORAL_TABLET | Freq: Four times a day (QID) | ORAL | 0 refills | Status: DC | PRN

## 2016-04-15 MED ORDER — OXYCODONE-ACETAMINOPHEN 5-325 MG PO TABS
2.0000 | ORAL_TABLET | Freq: Once | ORAL | Status: AC
  Administered 2016-04-15: 2 via ORAL
  Filled 2016-04-15: qty 2

## 2016-04-15 MED ORDER — DOCUSATE SODIUM 100 MG PO CAPS: 100.0000 mg | ORAL_CAPSULE | Freq: Two times a day (BID) | ORAL | 0 refills | Status: AC

## 2016-04-15 MED ORDER — ONDANSETRON 4 MG PO TBDP
4.0000 mg | ORAL_TABLET | Freq: Once | ORAL | Status: AC
  Administered 2016-04-15: 4 mg via ORAL
  Filled 2016-04-15: qty 1

## 2016-04-15 NOTE — ED Triage Notes (Signed)
Patient presents with R hip pain which began Thursday, but worsened today. Family denies any recent falls/injuries. Patient ambulates at home without device.

## 2016-04-15 NOTE — ED Notes (Signed)
Patient transported to X-ray 

## 2016-04-15 NOTE — ED Provider Notes (Signed)
TIME SEEN: 4:10 AM  CHIEF COMPLAINT: Right hip pain  HPI: Pt is a 82 y.o. female with history of CAD, hypertension, diabetes, dyslipidemia who presents emergency department with complaints of right knee and hip pain. States she has had palms with her right knee for many years. States that she thinks this is what is aggravating her right hip. States pain in the hip has been present for the past 5 days. Worsened tonight and she was unable to sleep. She is normally able to ambulate without assistance. States pain is worse with walking, movement. No back pain. No numbness, tingling or focal weakness. No bowel or bladder incontinence. No urinary retention. No fever. Denies any known injury to the hip or fall.  ROS: See HPI Constitutional: no fever  Eyes: no drainage  ENT: no runny nose   Cardiovascular:  no chest pain  Resp: no SOB  GI: no vomiting GU: no dysuria Integumentary: no rash  Allergy: no hives  Musculoskeletal: no leg swelling  Neurological: no slurred speech ROS otherwise negative  PAST MEDICAL HISTORY/PAST SURGICAL HISTORY:  Past Medical History:  Diagnosis Date  . Bell's palsy    Left facial droop  . Coronary artery disease    S/P PCI of the LAD, OM2, and circumflex  . Diabetes mellitus without complication ([place])   . Dyslipidemia   . Hypertension     MEDICATIONS:  Prior to Admission medications   Medication Sig Start Date End Date Taking? Authorizing Provider  aspirin EC 81 MG tablet Take 81 mg by mouth daily.    Historical Provider, MD  brimonidine (ALPHAGAN P) 0.1 % SOLN Place 1 drop into both eyes 3 (three) times daily.    Historical Provider, MD  budesonide-formoterol (SYMBICORT) 160-4.5 MCG/ACT inhaler Inhale 2 puffs into the lungs 2 (two) times daily as needed.    Historical Provider, MD  clopidogrel (PLAVIX) 75 MG tablet Take 75 mg by mouth daily.    Historical Provider, MD  dorzolamide (TRUSOPT) 2 % ophthalmic solution Place 1 drop into both eyes 3 (three)  times daily.    Historical Provider, MD  Fluticasone-Salmeterol (ADVAIR) 250-50 MCG/DOSE AEPB Inhale 1 puff into the lungs daily.    Historical Provider, MD  latanoprost (XALATAN) 0.005 % ophthalmic solution Place 1 drop into both eyes at bedtime.    Historical Provider, MD  metFORMIN (GLUCOPHAGE) 500 MG tablet Take 1,000 mg by mouth at bedtime.    Historical Provider, MD  niacin (NIASPAN) 1000 MG CR tablet Take 1,000 mg by mouth at bedtime.    Historical Provider, MD  nitroGLYCERIN (NITROSTAT) 0.4 MG SL tablet Place 0.4 mg under the tongue every 5 (five) minutes as needed for chest pain.    Historical Provider, MD  rosuvastatin (CRESTOR) 20 MG tablet Take 20 mg by mouth at bedtime.    Historical Provider, MD    ALLERGIES:  Allergies  Allergen Reactions  . Penicillins     SOCIAL HISTORY:  Social History  Substance Use Topics  . Smoking status: Former Smoker    Years: 24.00    Quit date: 06/24/1976  . Smokeless tobacco: Never Used     Comment: "when I was younger ([age])"  . Alcohol use No    FAMILY HISTORY: Family History  Problem Relation Age of Onset  . Heart disease Sister   . Stroke Brother     EXAM: BP 174/100 (BP Location: Right Arm)   Pulse 69   Resp 22   SpO2 99%  CONSTITUTIONAL: Alert and  oriented and responds appropriately to questions. Appears uncomfortable but is nontoxic, afebrile, well-nourished, Obese, appears younger than stated age HEAD: Normocephalic EYES: Conjunctivae clear, PERRL ENT: normal nose; no rhinorrhea; moist mucous membranes NECK: Supple, no meningismus, no LAD  CARD: RRR; S1 and S2 appreciated; no murmurs, no clicks, no rubs, no gallops RESP: Normal chest excursion without splinting or tachypnea; breath sounds clear and equal bilaterally; no wheezes, no rhonchi, no rales, no hypoxia or respiratory distress, speaking full sentences ABD/GI: Normal bowel sounds; non-distended; soft, non-tender, no rebound, no guarding, no peritoneal  signs BACK:  The back appears normal and is non-tender to palpation, there is no CVA tenderness, no midline spinal tenderness or step-off or deformity EXT: Patient is tender to palpation in the right gluteus musculature and posterior hip, no leg length discrepancy, full range of motion in bilateral hips with full flexion and extension, pelvis is stable, no joint effusion, tender to palpation over the anterior right knee diffusely without bony deformity, no erythema or warmth. 2+ DP pulses bilaterally. Normal ROM in all joints; otherwise x-rays are non-tender to palpation; no edema; normal capillary refill; no cyanosis, no calf tenderness or swelling    SKIN: Normal color for age and race; warm; no rash NEURO: Moves all extremities equally, sensation to light touch intact diffusely, cranial nerves II through XII intact PSYCH: The patient's mood and manner are appropriate. Grooming and personal hygiene are appropriate.  MEDICAL DECISION MAKING: Patient here with chronic right knee pain that she thinks aggravated her hip. Suspect arthritis versus sciatica. No neurologic deficits on exam. Legs are warm and well-perfused. No history of injury. No leg length discrepancy. Nothing to suggest gout, septic arthritis, cellulitis. Will obtain x-rays of the hip, knee given her age to rule out fracture. Will give Percocet for pain and reassess.  ED PROGRESS: Patient's x-rays are negative. She reports feeling much better and is ambulatory with a slight limp. She does have a cane at home which she continues. Have discussed with her return precautions. Family at bedside present and are comfortable with plan to take her home. She has a PCP for follow-up. Discussed with patient that I suspect this may be sciatica. She has done well with Percocet. No hypoxia, nausea or vomiting, dizziness. We'll discharge with prescription for the same.   At this time, I do not feel there is any life-threatening condition present. I have  reviewed and discussed all results (EKG, imaging, lab, urine as appropriate), exam findings with patient/family. I have reviewed nursing notes and appropriate previous records.  I feel the patient is safe to be discharged home without further emergent workup and can continue workup as an outpatient. Discussed usual and customary return precautions. Patient/family verbalize understanding and are comfortable with this plan.  Outpatient follow-up has been provided. All questions have been answered.      [place], DO 04/15/16 [phone]

## 2016-04-18 DIAGNOSIS — M5441 Lumbago with sciatica, right side: Secondary | ICD-10-CM | POA: Diagnosis not present

## 2016-05-06 DIAGNOSIS — E119 Type 2 diabetes mellitus without complications: Secondary | ICD-10-CM | POA: Diagnosis not present

## 2016-05-06 DIAGNOSIS — J441 Chronic obstructive pulmonary disease with (acute) exacerbation: Secondary | ICD-10-CM | POA: Diagnosis not present

## 2016-05-06 DIAGNOSIS — I10 Essential (primary) hypertension: Secondary | ICD-10-CM | POA: Diagnosis not present

## 2016-05-06 DIAGNOSIS — M5441 Lumbago with sciatica, right side: Secondary | ICD-10-CM | POA: Diagnosis not present

## 2016-06-10 DIAGNOSIS — E119 Type 2 diabetes mellitus without complications: Secondary | ICD-10-CM | POA: Diagnosis not present

## 2016-06-10 DIAGNOSIS — I10 Essential (primary) hypertension: Secondary | ICD-10-CM | POA: Diagnosis not present

## 2016-06-10 DIAGNOSIS — J441 Chronic obstructive pulmonary disease with (acute) exacerbation: Secondary | ICD-10-CM | POA: Diagnosis not present

## 2016-07-01 DIAGNOSIS — E119 Type 2 diabetes mellitus without complications: Secondary | ICD-10-CM | POA: Diagnosis not present

## 2016-07-01 DIAGNOSIS — I10 Essential (primary) hypertension: Secondary | ICD-10-CM | POA: Diagnosis not present

## 2016-07-01 DIAGNOSIS — J441 Chronic obstructive pulmonary disease with (acute) exacerbation: Secondary | ICD-10-CM | POA: Diagnosis not present

## 2016-07-21 ENCOUNTER — Encounter: Payer: Self-pay | Admitting: Orthopaedic Surgery

## 2016-07-21 ENCOUNTER — Encounter: Payer: Self-pay | Admitting: Orthopedic Surgery

## 2016-08-05 DIAGNOSIS — S81802A Unspecified open wound, left lower leg, initial encounter: Secondary | ICD-10-CM | POA: Diagnosis not present

## 2016-08-11 DIAGNOSIS — E119 Type 2 diabetes mellitus without complications: Secondary | ICD-10-CM | POA: Diagnosis not present

## 2016-08-11 DIAGNOSIS — Z7982 Long term (current) use of aspirin: Secondary | ICD-10-CM | POA: Diagnosis not present

## 2016-08-11 DIAGNOSIS — E785 Hyperlipidemia, unspecified: Secondary | ICD-10-CM | POA: Diagnosis not present

## 2016-08-11 DIAGNOSIS — Z792 Long term (current) use of antibiotics: Secondary | ICD-10-CM | POA: Diagnosis not present

## 2016-08-11 DIAGNOSIS — S81802D Unspecified open wound, left lower leg, subsequent encounter: Secondary | ICD-10-CM | POA: Diagnosis not present

## 2016-08-11 DIAGNOSIS — Z7952 Long term (current) use of systemic steroids: Secondary | ICD-10-CM | POA: Diagnosis not present

## 2016-08-11 DIAGNOSIS — I2510 Atherosclerotic heart disease of native coronary artery without angina pectoris: Secondary | ICD-10-CM | POA: Diagnosis not present

## 2016-08-11 DIAGNOSIS — G510 Bell's palsy: Secondary | ICD-10-CM | POA: Diagnosis not present

## 2016-08-11 DIAGNOSIS — Z87891 Personal history of nicotine dependence: Secondary | ICD-10-CM | POA: Diagnosis not present

## 2016-08-11 DIAGNOSIS — I10 Essential (primary) hypertension: Secondary | ICD-10-CM | POA: Diagnosis not present

## 2016-08-11 DIAGNOSIS — Z7984 Long term (current) use of oral hypoglycemic drugs: Secondary | ICD-10-CM | POA: Diagnosis not present

## 2016-08-26 DIAGNOSIS — J441 Chronic obstructive pulmonary disease with (acute) exacerbation: Secondary | ICD-10-CM | POA: Diagnosis not present

## 2016-08-26 DIAGNOSIS — I10 Essential (primary) hypertension: Secondary | ICD-10-CM | POA: Diagnosis not present

## 2016-08-26 DIAGNOSIS — S81802A Unspecified open wound, left lower leg, initial encounter: Secondary | ICD-10-CM | POA: Diagnosis not present

## 2016-09-30 DIAGNOSIS — S81802D Unspecified open wound, left lower leg, subsequent encounter: Secondary | ICD-10-CM | POA: Diagnosis not present

## 2016-09-30 DIAGNOSIS — I10 Essential (primary) hypertension: Secondary | ICD-10-CM | POA: Diagnosis not present

## 2016-09-30 DIAGNOSIS — E119 Type 2 diabetes mellitus without complications: Secondary | ICD-10-CM | POA: Diagnosis not present

## 2017-01-20 DIAGNOSIS — E119 Type 2 diabetes mellitus without complications: Secondary | ICD-10-CM | POA: Diagnosis not present

## 2017-01-20 DIAGNOSIS — H401131 Primary open-angle glaucoma, bilateral, mild stage: Secondary | ICD-10-CM | POA: Diagnosis not present

## 2017-01-20 DIAGNOSIS — Z961 Presence of intraocular lens: Secondary | ICD-10-CM | POA: Diagnosis not present

## 2017-02-03 DIAGNOSIS — J441 Chronic obstructive pulmonary disease with (acute) exacerbation: Secondary | ICD-10-CM | POA: Diagnosis not present

## 2017-02-03 DIAGNOSIS — I10 Essential (primary) hypertension: Secondary | ICD-10-CM | POA: Diagnosis not present

## 2017-02-03 DIAGNOSIS — N390 Urinary tract infection, site not specified: Secondary | ICD-10-CM | POA: Diagnosis not present

## 2017-02-04 IMAGING — DX DG KNEE COMPLETE 4+V*R*
4 series · 4 of 4 positions shown · non-contrast
Comparison: Right knee radiograph dated 12/05/2010

CLINICAL DATA: 85-year-old female with right knee pain

EXAM:
RIGHT KNEE - COMPLETE 4+ VIEW

[knee ap]
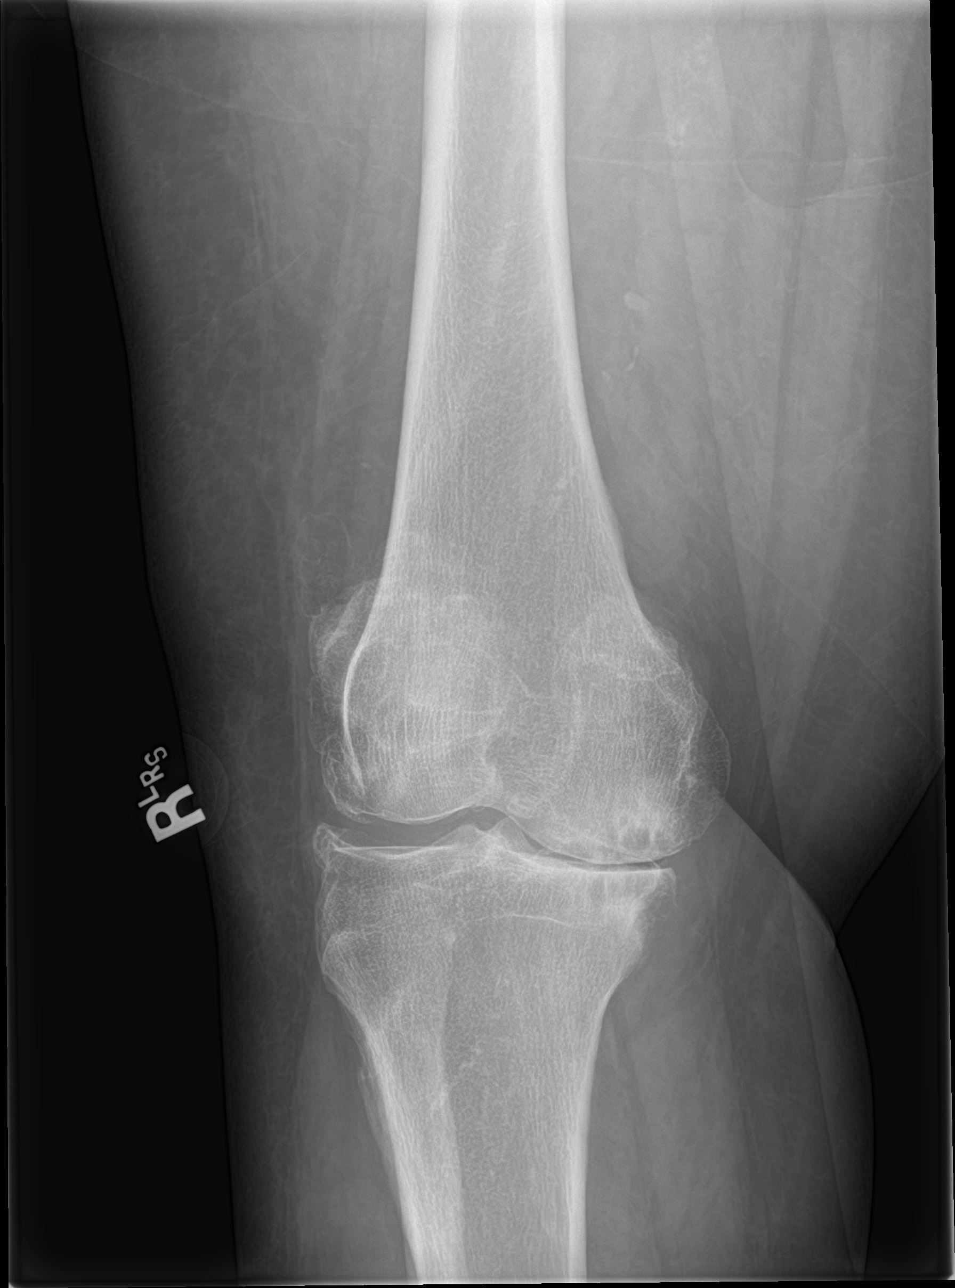

[knee lat]
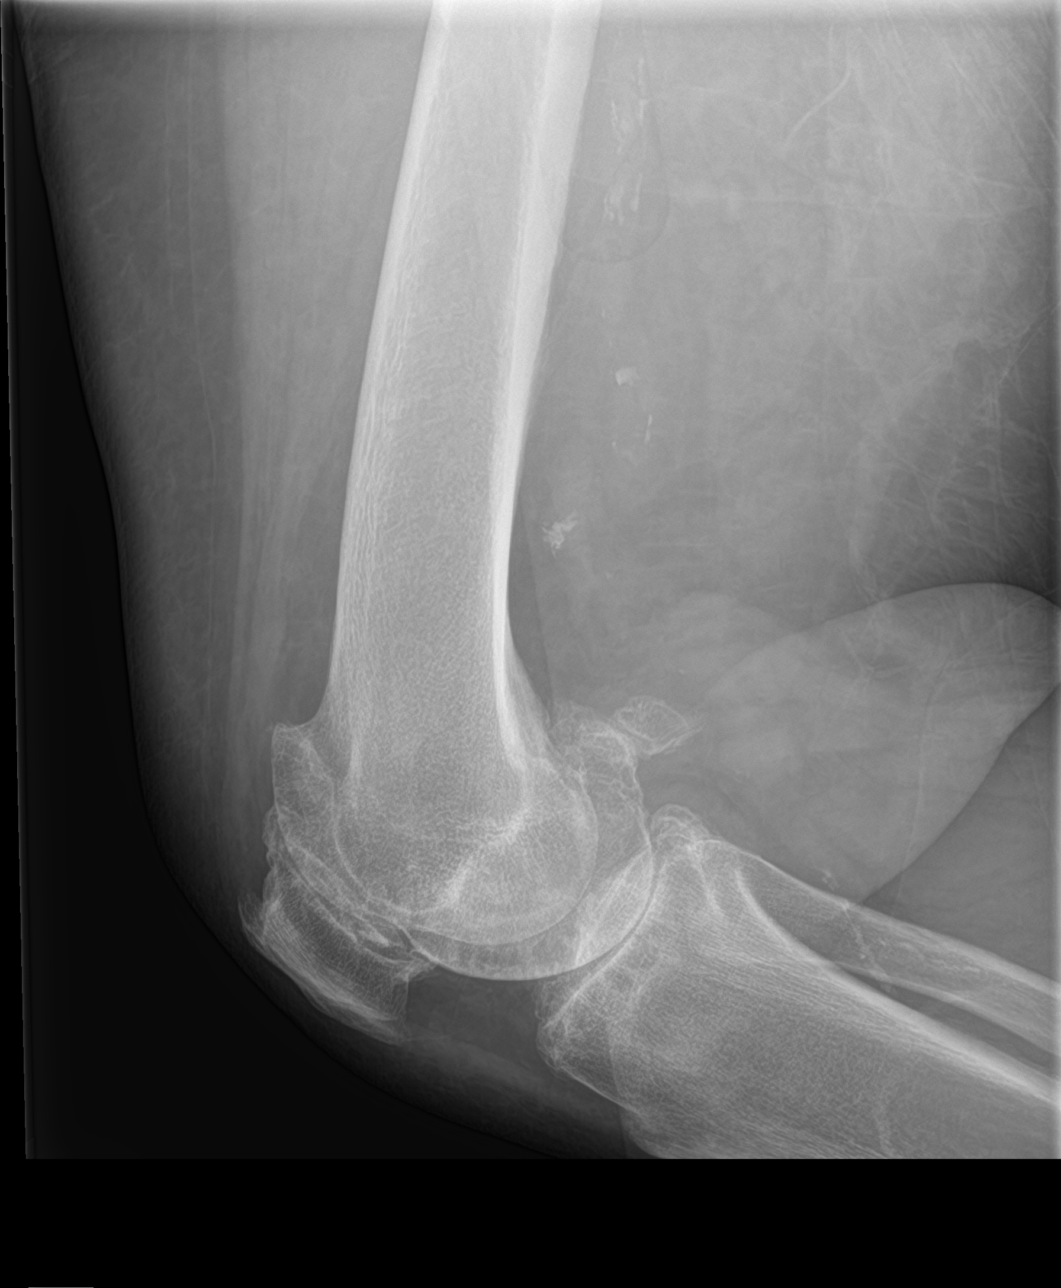

[knee obl (1 of 2)]
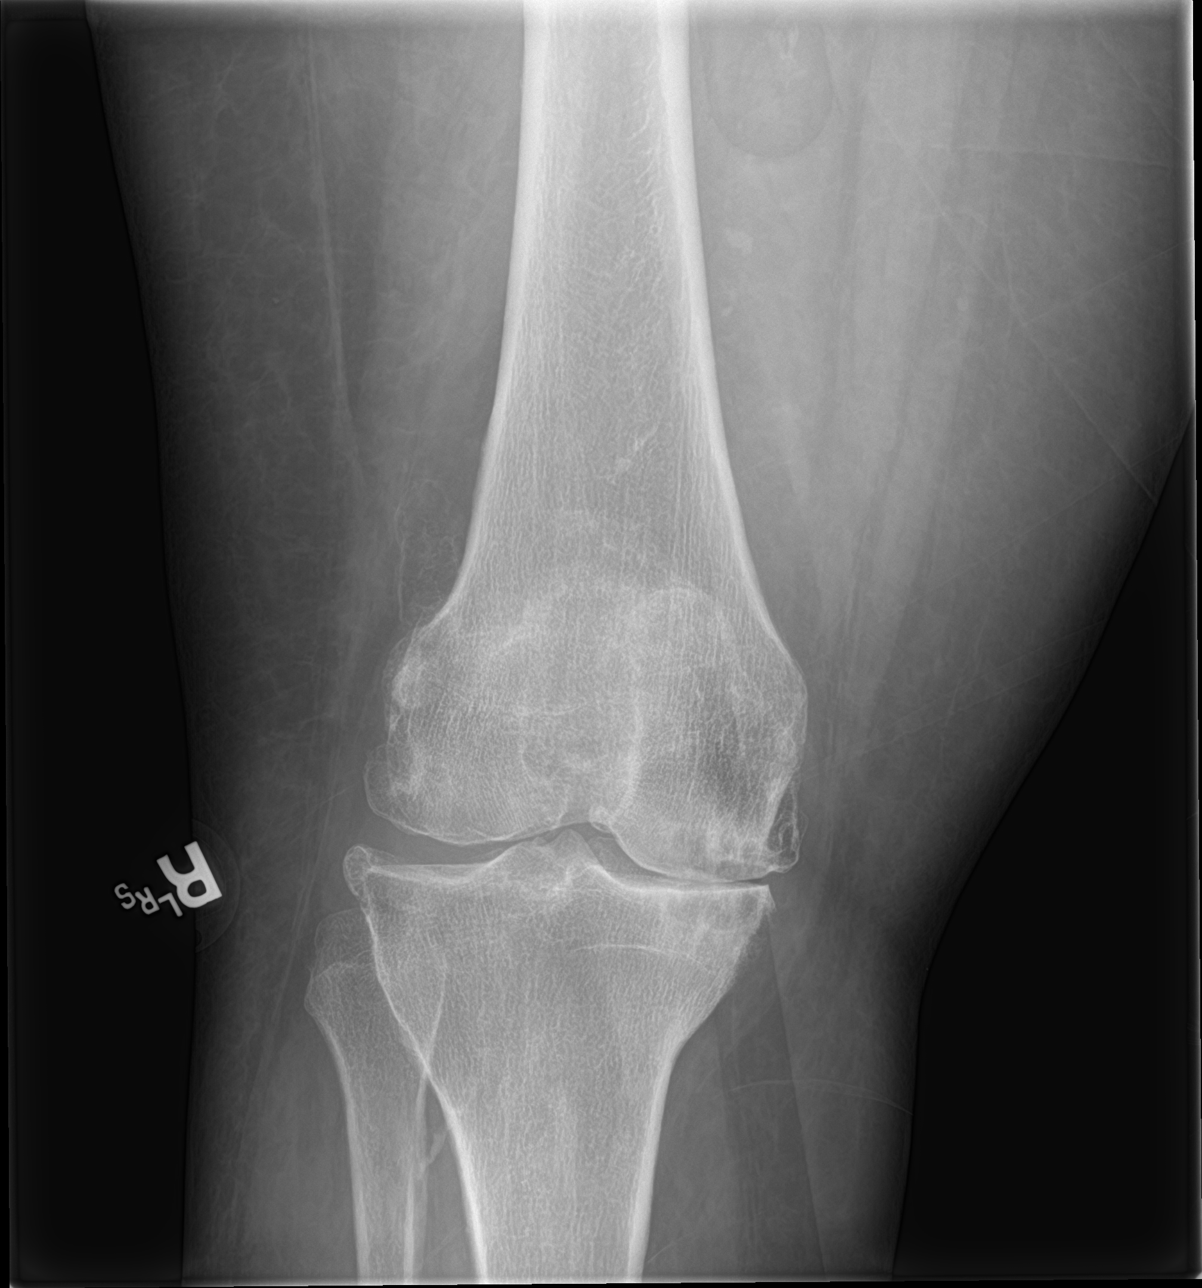

[knee obl (2 of 2)]
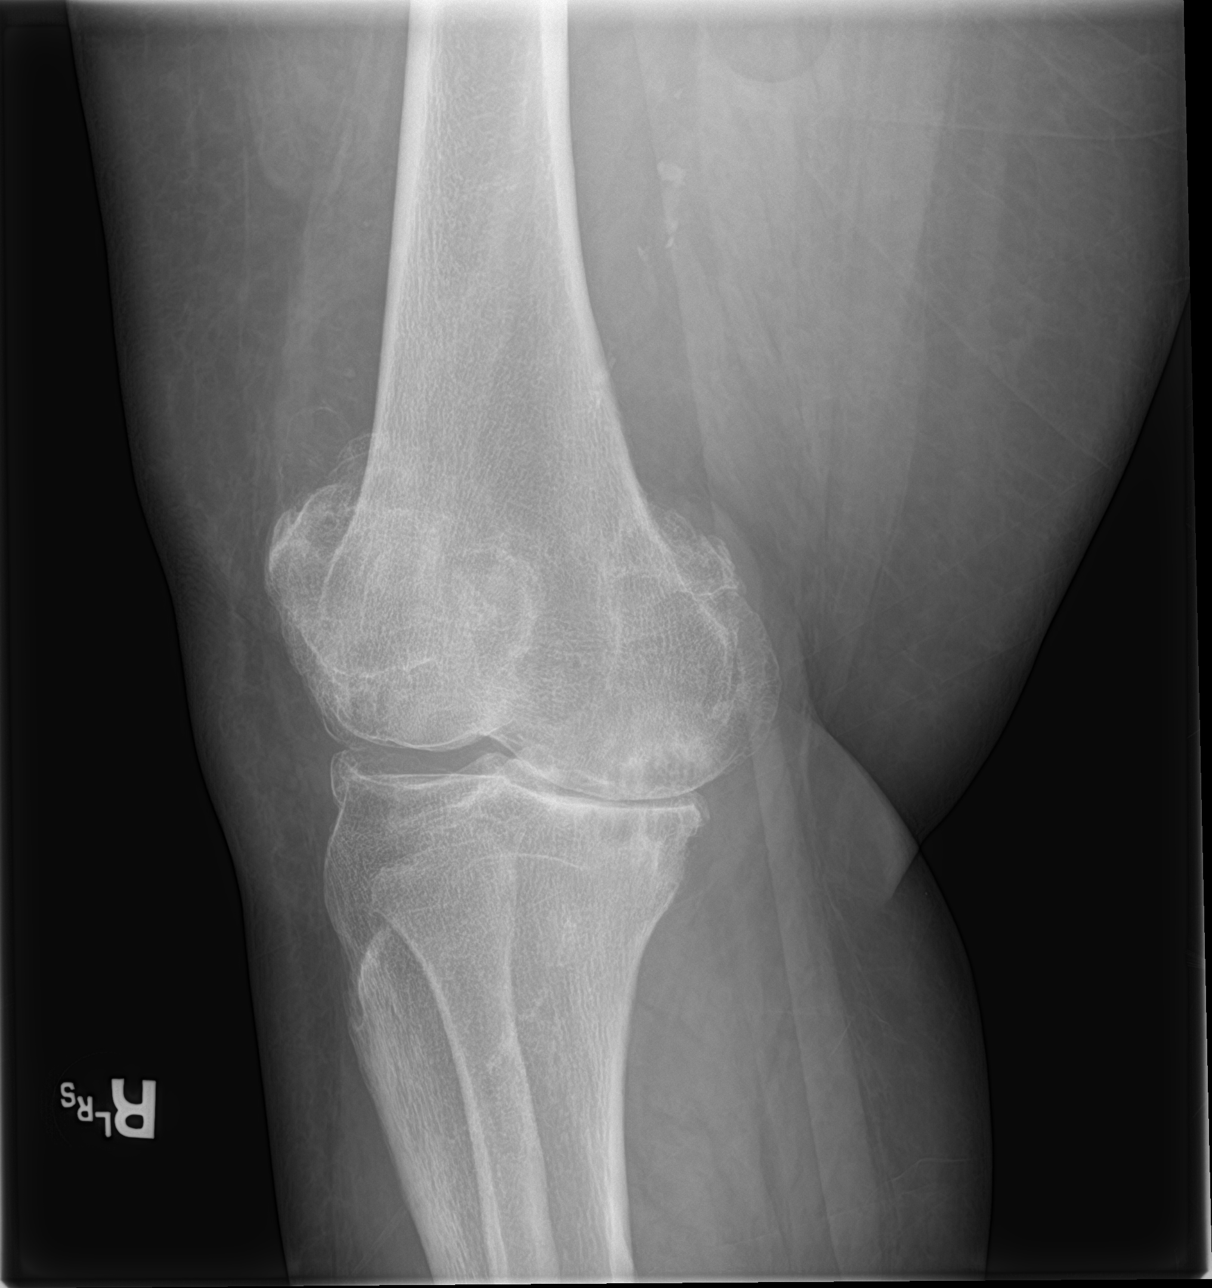

[4 of 4 positions shown; findings below may reference images not displayed]

FINDINGS: There is no acute fracture or dislocation. There is advanced
osteoarthritic changes with tricompartmental narrowing most
prominent involving the medial compartment. Subcortical cyst noted.
No joint effusion. The soft tissues appear unremarkable.
IMPRESSION: No acute fracture or dislocation.

Osteoarthritic changes.

## 2017-02-04 IMAGING — DX DG HIP (WITH OR WITHOUT PELVIS) 2-3V*R*
3 series · 3 of 3 positions shown · non-contrast
Comparison: None.

CLINICAL DATA: 85-year-old female with right knee pain. No known
injury.

EXAM:
DG HIP (WITH OR WITHOUT PELVIS) 2-3V RIGHT

[pelvis ap]
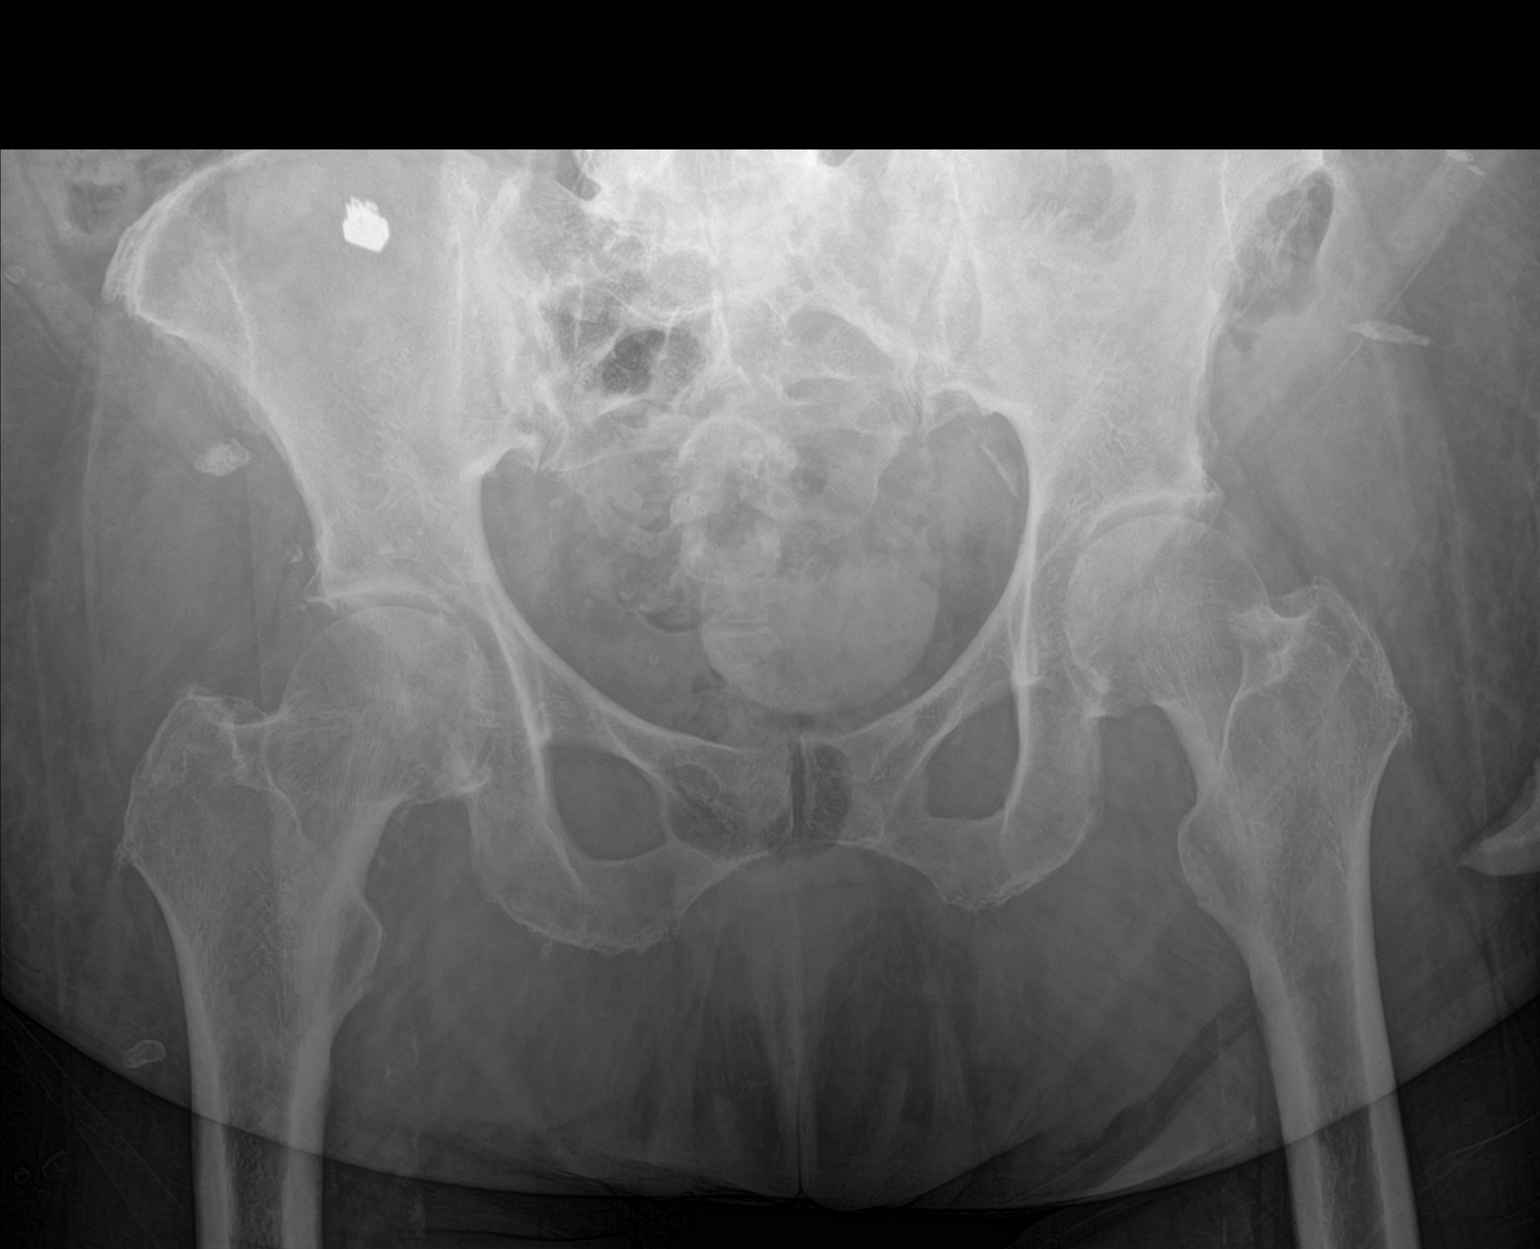

[hip ap]
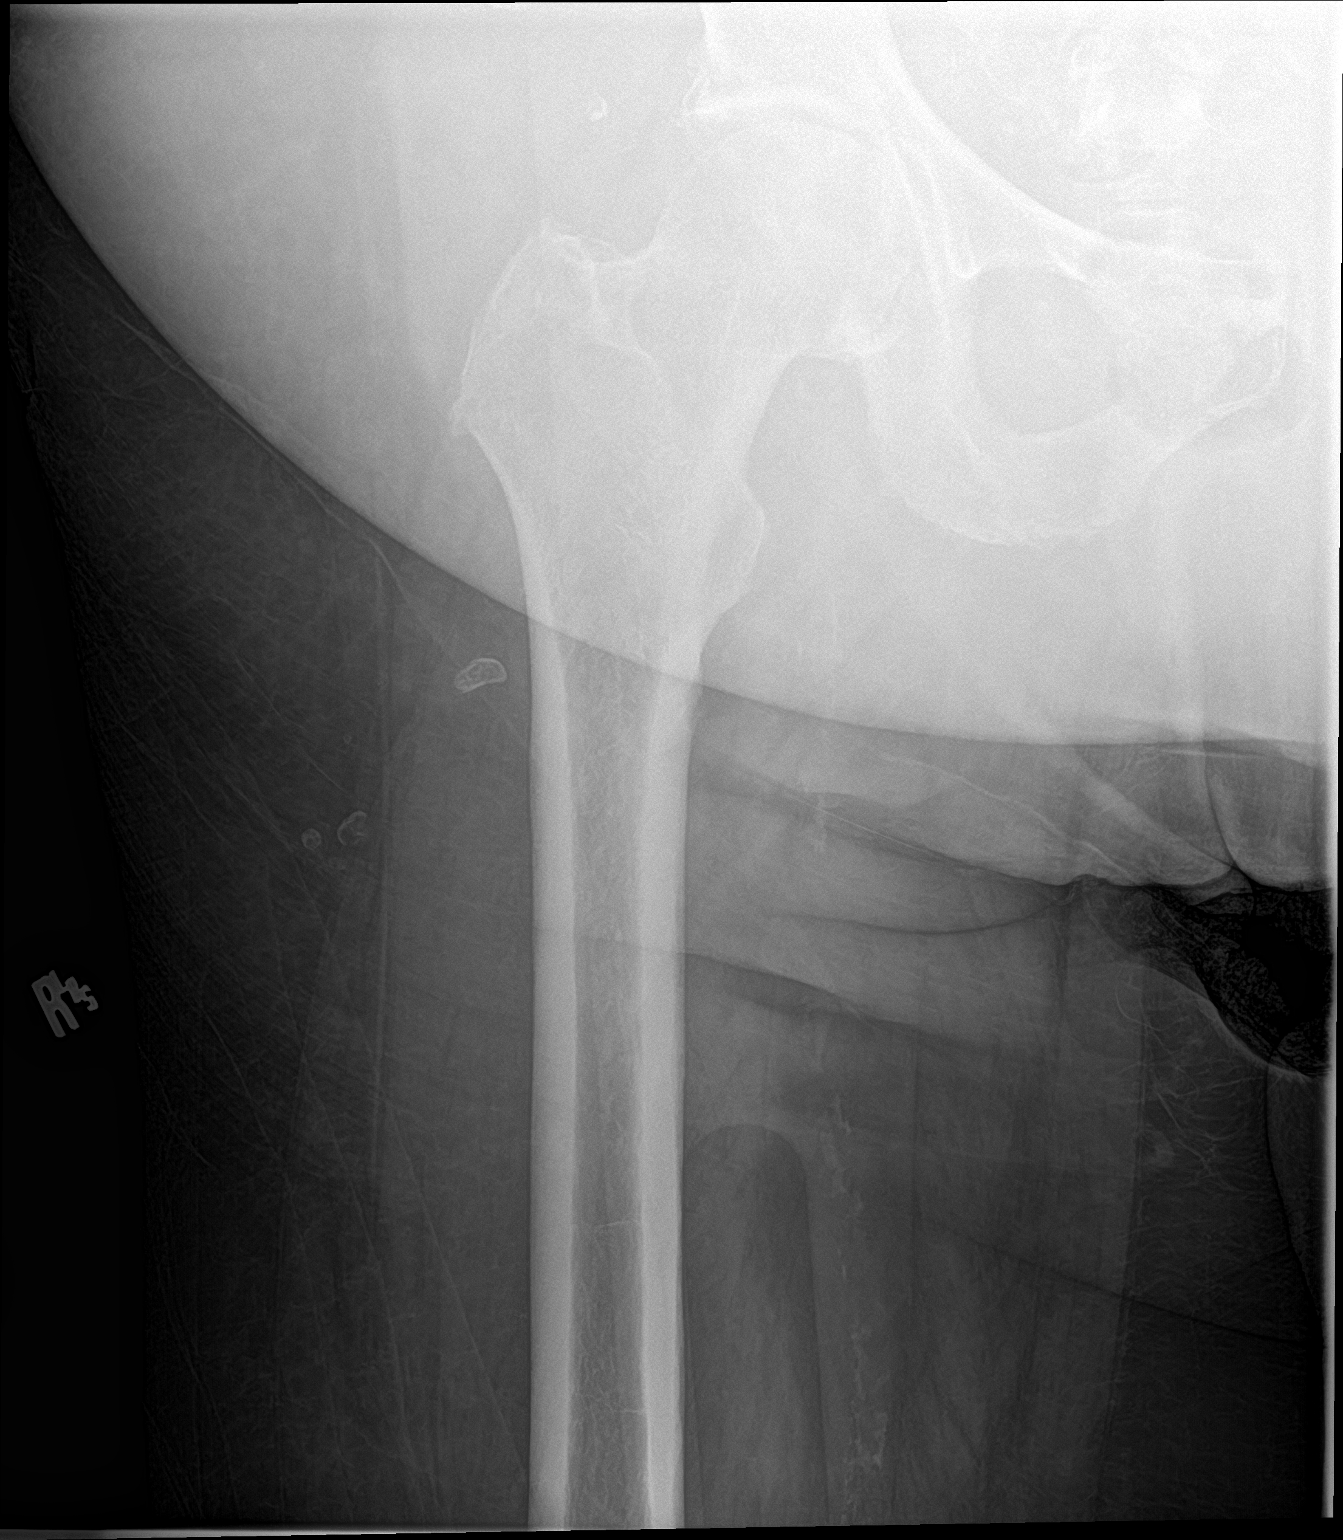

[hip lat]
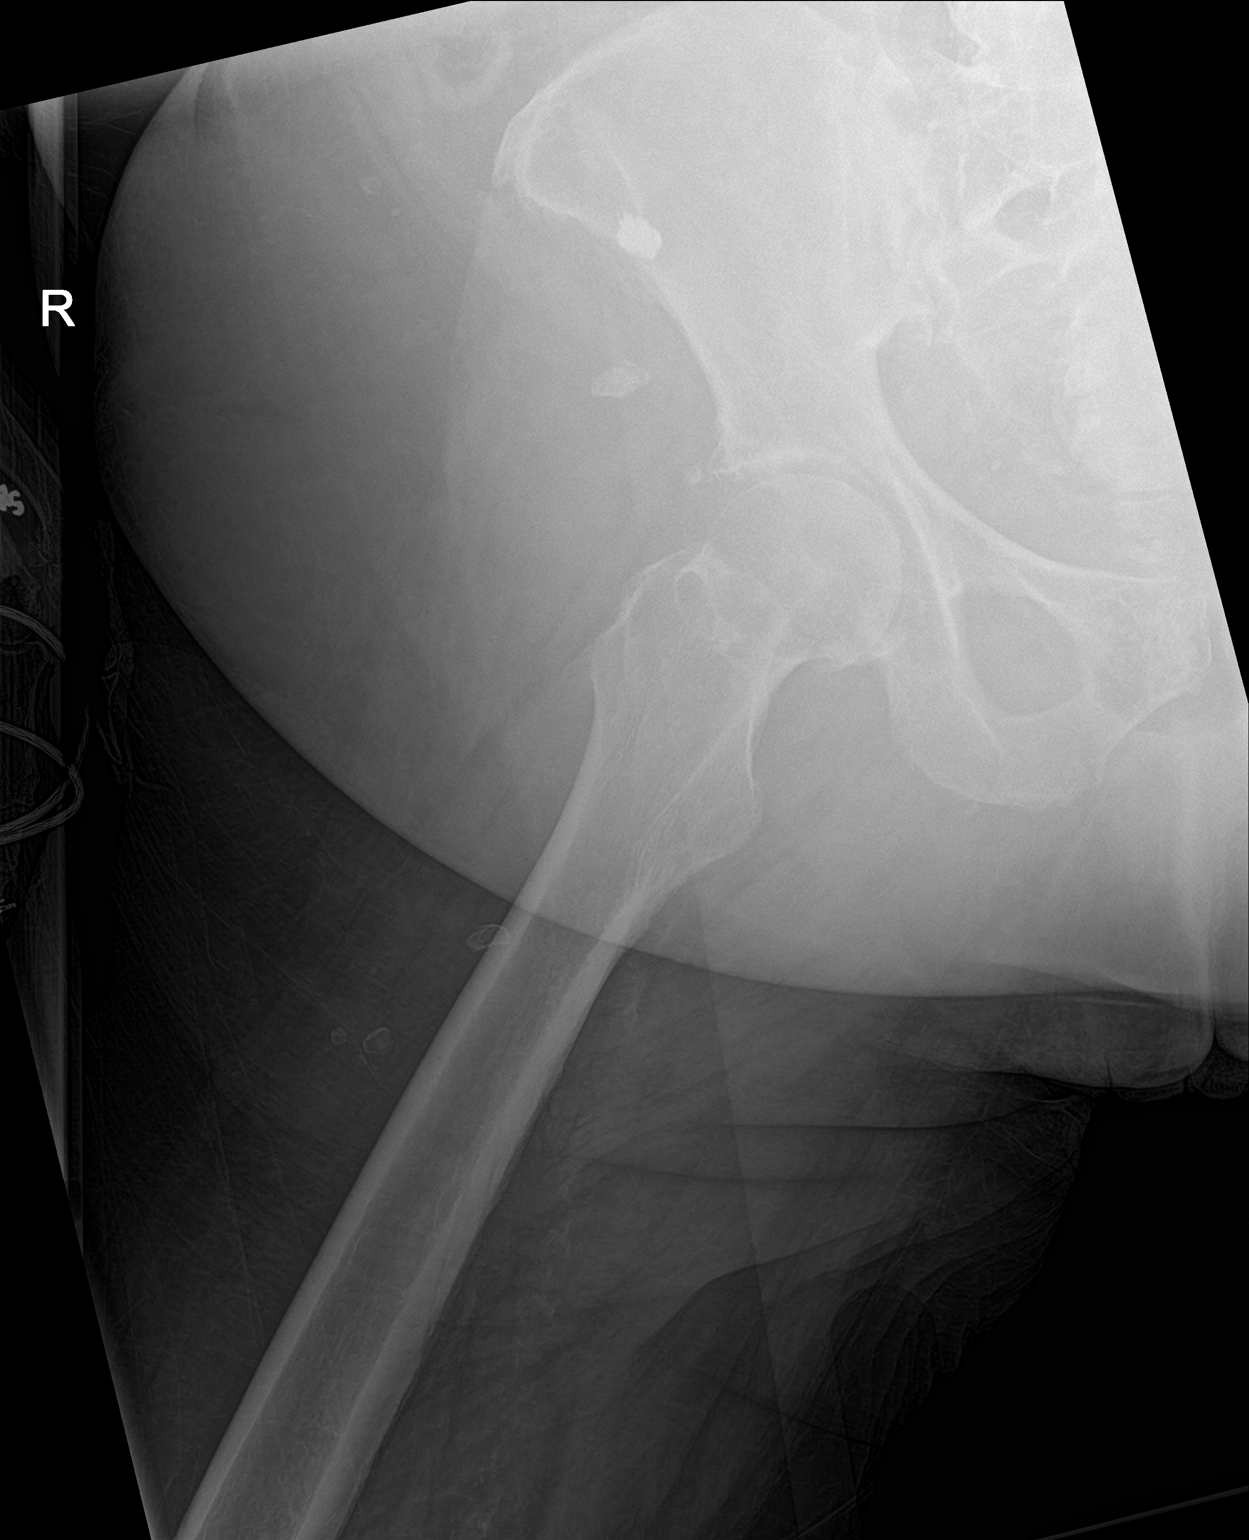

[3 of 3 positions shown; findings below may reference images not displayed]

FINDINGS: There is no acute fracture or dislocation. There is osteopenia with
osteoarthritic changes of the hips. Degenerative changes of the
visualized lower lumbar spine. A metallic density noted over the
right iliac bone. The soft tissues are grossly unremarkable.
IMPRESSION: No acute fracture or dislocation.

## 2017-03-10 DIAGNOSIS — E119 Type 2 diabetes mellitus without complications: Secondary | ICD-10-CM | POA: Diagnosis not present

## 2017-03-10 DIAGNOSIS — I10 Essential (primary) hypertension: Secondary | ICD-10-CM | POA: Diagnosis not present

## 2017-03-10 DIAGNOSIS — T7840XA Allergy, unspecified, initial encounter: Secondary | ICD-10-CM | POA: Diagnosis not present

## 2017-03-10 DIAGNOSIS — L03116 Cellulitis of left lower limb: Secondary | ICD-10-CM | POA: Diagnosis not present

## 2017-04-29 DIAGNOSIS — I10 Essential (primary) hypertension: Secondary | ICD-10-CM | POA: Diagnosis not present

## 2017-04-29 DIAGNOSIS — I8312 Varicose veins of left lower extremity with inflammation: Secondary | ICD-10-CM | POA: Diagnosis not present

## 2017-04-29 DIAGNOSIS — Z8673 Personal history of transient ischemic attack (TIA), and cerebral infarction without residual deficits: Secondary | ICD-10-CM | POA: Diagnosis not present

## 2017-04-29 DIAGNOSIS — E7800 Pure hypercholesterolemia, unspecified: Secondary | ICD-10-CM | POA: Diagnosis not present

## 2017-04-29 DIAGNOSIS — J449 Chronic obstructive pulmonary disease, unspecified: Secondary | ICD-10-CM | POA: Diagnosis not present

## 2017-04-29 DIAGNOSIS — Z7951 Long term (current) use of inhaled steroids: Secondary | ICD-10-CM | POA: Diagnosis not present

## 2017-04-29 DIAGNOSIS — Z7902 Long term (current) use of antithrombotics/antiplatelets: Secondary | ICD-10-CM | POA: Diagnosis not present

## 2017-04-29 DIAGNOSIS — Z79899 Other long term (current) drug therapy: Secondary | ICD-10-CM | POA: Diagnosis not present

## 2017-04-29 DIAGNOSIS — I872 Venous insufficiency (chronic) (peripheral): Secondary | ICD-10-CM | POA: Diagnosis not present

## 2017-04-29 DIAGNOSIS — I252 Old myocardial infarction: Secondary | ICD-10-CM | POA: Diagnosis not present

## 2017-05-05 DIAGNOSIS — I10 Essential (primary) hypertension: Secondary | ICD-10-CM | POA: Diagnosis not present

## 2017-05-05 DIAGNOSIS — I7789 Other specified disorders of arteries and arterioles: Secondary | ICD-10-CM | POA: Diagnosis not present

## 2017-05-05 DIAGNOSIS — I83029 Varicose veins of left lower extremity with ulcer of unspecified site: Secondary | ICD-10-CM | POA: Diagnosis not present

## 2017-05-12 DIAGNOSIS — I872 Venous insufficiency (chronic) (peripheral): Secondary | ICD-10-CM | POA: Diagnosis not present

## 2017-05-13 DIAGNOSIS — I872 Venous insufficiency (chronic) (peripheral): Secondary | ICD-10-CM | POA: Diagnosis not present

## 2017-05-13 DIAGNOSIS — I8311 Varicose veins of right lower extremity with inflammation: Secondary | ICD-10-CM | POA: Diagnosis not present

## 2017-05-27 DIAGNOSIS — I8312 Varicose veins of left lower extremity with inflammation: Secondary | ICD-10-CM | POA: Diagnosis not present

## 2017-05-27 DIAGNOSIS — Z09 Encounter for follow-up examination after completed treatment for conditions other than malignant neoplasm: Secondary | ICD-10-CM | POA: Diagnosis not present

## 2017-05-27 DIAGNOSIS — I8311 Varicose veins of right lower extremity with inflammation: Secondary | ICD-10-CM | POA: Diagnosis not present

## 2017-05-27 DIAGNOSIS — Z872 Personal history of diseases of the skin and subcutaneous tissue: Secondary | ICD-10-CM | POA: Diagnosis not present

## 2017-07-28 DIAGNOSIS — H401132 Primary open-angle glaucoma, bilateral, moderate stage: Secondary | ICD-10-CM | POA: Diagnosis not present

## 2017-08-18 DIAGNOSIS — I10 Essential (primary) hypertension: Secondary | ICD-10-CM | POA: Diagnosis not present

## 2017-08-18 DIAGNOSIS — M130 Polyarthritis, unspecified: Secondary | ICD-10-CM | POA: Diagnosis not present

## 2017-11-17 DIAGNOSIS — I10 Essential (primary) hypertension: Secondary | ICD-10-CM | POA: Diagnosis not present

## 2017-11-17 DIAGNOSIS — E119 Type 2 diabetes mellitus without complications: Secondary | ICD-10-CM | POA: Diagnosis not present

## 2017-11-17 DIAGNOSIS — M130 Polyarthritis, unspecified: Secondary | ICD-10-CM | POA: Diagnosis not present

## 2017-11-17 DIAGNOSIS — J441 Chronic obstructive pulmonary disease with (acute) exacerbation: Secondary | ICD-10-CM | POA: Diagnosis not present

## 2018-01-27 DIAGNOSIS — H35372 Puckering of macula, left eye: Secondary | ICD-10-CM | POA: Diagnosis not present

## 2018-01-27 DIAGNOSIS — Z961 Presence of intraocular lens: Secondary | ICD-10-CM | POA: Diagnosis not present

## 2018-01-27 DIAGNOSIS — H401132 Primary open-angle glaucoma, bilateral, moderate stage: Secondary | ICD-10-CM | POA: Diagnosis not present

## 2018-01-27 DIAGNOSIS — E119 Type 2 diabetes mellitus without complications: Secondary | ICD-10-CM | POA: Diagnosis not present

## 2018-02-16 DIAGNOSIS — M130 Polyarthritis, unspecified: Secondary | ICD-10-CM | POA: Diagnosis not present

## 2018-02-16 DIAGNOSIS — J441 Chronic obstructive pulmonary disease with (acute) exacerbation: Secondary | ICD-10-CM | POA: Diagnosis not present

## 2018-02-16 DIAGNOSIS — E119 Type 2 diabetes mellitus without complications: Secondary | ICD-10-CM | POA: Diagnosis not present

## 2018-02-16 DIAGNOSIS — I10 Essential (primary) hypertension: Secondary | ICD-10-CM | POA: Diagnosis not present

## 2018-03-23 DIAGNOSIS — I10 Essential (primary) hypertension: Secondary | ICD-10-CM | POA: Diagnosis not present

## 2018-03-23 DIAGNOSIS — M130 Polyarthritis, unspecified: Secondary | ICD-10-CM | POA: Diagnosis not present

## 2018-03-23 DIAGNOSIS — Z6841 Body Mass Index (BMI) 40.0 and over, adult: Secondary | ICD-10-CM | POA: Diagnosis not present

## 2018-03-23 DIAGNOSIS — J441 Chronic obstructive pulmonary disease with (acute) exacerbation: Secondary | ICD-10-CM | POA: Diagnosis not present

## 2018-07-20 DIAGNOSIS — E119 Type 2 diabetes mellitus without complications: Secondary | ICD-10-CM | POA: Diagnosis not present

## 2018-07-20 DIAGNOSIS — Z0000 Encounter for general adult medical examination without abnormal findings: Secondary | ICD-10-CM | POA: Diagnosis not present

## 2018-07-20 DIAGNOSIS — Z6841 Body Mass Index (BMI) 40.0 and over, adult: Secondary | ICD-10-CM | POA: Diagnosis not present

## 2018-07-20 DIAGNOSIS — M130 Polyarthritis, unspecified: Secondary | ICD-10-CM | POA: Diagnosis not present

## 2018-07-20 DIAGNOSIS — I10 Essential (primary) hypertension: Secondary | ICD-10-CM | POA: Diagnosis not present

## 2018-08-03 DIAGNOSIS — H401132 Primary open-angle glaucoma, bilateral, moderate stage: Secondary | ICD-10-CM | POA: Diagnosis not present

## 2018-11-16 DIAGNOSIS — E119 Type 2 diabetes mellitus without complications: Secondary | ICD-10-CM | POA: Diagnosis not present

## 2018-11-16 DIAGNOSIS — I10 Essential (primary) hypertension: Secondary | ICD-10-CM | POA: Diagnosis not present

## 2018-11-16 DIAGNOSIS — M130 Polyarthritis, unspecified: Secondary | ICD-10-CM | POA: Diagnosis not present

## 2019-03-22 DIAGNOSIS — I10 Essential (primary) hypertension: Secondary | ICD-10-CM | POA: Diagnosis not present

## 2019-03-22 DIAGNOSIS — E1169 Type 2 diabetes mellitus with other specified complication: Secondary | ICD-10-CM | POA: Diagnosis not present

## 2019-03-22 DIAGNOSIS — E661 Drug-induced obesity: Secondary | ICD-10-CM | POA: Diagnosis not present

## 2019-03-22 DIAGNOSIS — R635 Abnormal weight gain: Secondary | ICD-10-CM | POA: Diagnosis not present

## 2019-03-22 DIAGNOSIS — M130 Polyarthritis, unspecified: Secondary | ICD-10-CM | POA: Diagnosis not present

## 2019-03-22 DIAGNOSIS — J441 Chronic obstructive pulmonary disease with (acute) exacerbation: Secondary | ICD-10-CM | POA: Diagnosis not present

## 2019-04-14 ENCOUNTER — Other Ambulatory Visit: Payer: Self-pay

## 2019-04-25 DIAGNOSIS — E119 Type 2 diabetes mellitus without complications: Secondary | ICD-10-CM | POA: Diagnosis not present

## 2019-04-25 DIAGNOSIS — H35372 Puckering of macula, left eye: Secondary | ICD-10-CM | POA: Diagnosis not present

## 2019-04-25 DIAGNOSIS — Z961 Presence of intraocular lens: Secondary | ICD-10-CM | POA: Diagnosis not present

## 2019-04-25 DIAGNOSIS — H401131 Primary open-angle glaucoma, bilateral, mild stage: Secondary | ICD-10-CM | POA: Diagnosis not present

## 2019-05-09 DIAGNOSIS — S81802D Unspecified open wound, left lower leg, subsequent encounter: Secondary | ICD-10-CM | POA: Diagnosis not present

## 2019-05-09 DIAGNOSIS — E1169 Type 2 diabetes mellitus with other specified complication: Secondary | ICD-10-CM | POA: Diagnosis not present

## 2019-05-09 DIAGNOSIS — I10 Essential (primary) hypertension: Secondary | ICD-10-CM | POA: Diagnosis not present

## 2019-05-09 DIAGNOSIS — J441 Chronic obstructive pulmonary disease with (acute) exacerbation: Secondary | ICD-10-CM | POA: Diagnosis not present

## 2019-07-11 DIAGNOSIS — E1169 Type 2 diabetes mellitus with other specified complication: Secondary | ICD-10-CM | POA: Diagnosis not present

## 2019-07-11 DIAGNOSIS — M130 Polyarthritis, unspecified: Secondary | ICD-10-CM | POA: Diagnosis not present

## 2019-07-11 DIAGNOSIS — I10 Essential (primary) hypertension: Secondary | ICD-10-CM | POA: Diagnosis not present

## 2019-10-11 ENCOUNTER — Inpatient Hospital Stay (HOSPITAL_COMMUNITY)
Admission: EM | Admit: 2019-10-11 | Discharge: 2019-10-25 | DRG: 177 | Disposition: A | Discharge status: Skilled Nursing Facility | Payer: PPO | Attending: Internal Medicine | Admitting: Internal Medicine

## 2019-10-11 ENCOUNTER — Other Ambulatory Visit: Payer: Self-pay

## 2019-10-11 ENCOUNTER — Emergency Department (HOSPITAL_COMMUNITY): Payer: PPO

## 2019-10-11 ENCOUNTER — Encounter (HOSPITAL_COMMUNITY): Payer: Self-pay | Admitting: Emergency Medicine

## 2019-10-11 DIAGNOSIS — R791 Abnormal coagulation profile: Secondary | ICD-10-CM | POA: Diagnosis not present

## 2019-10-11 DIAGNOSIS — Z6841 Body Mass Index (BMI) 40.0 and over, adult: Secondary | ICD-10-CM | POA: Diagnosis not present

## 2019-10-11 DIAGNOSIS — Z955 Presence of coronary angioplasty implant and graft: Secondary | ICD-10-CM | POA: Diagnosis not present

## 2019-10-11 DIAGNOSIS — T380X5A Adverse effect of glucocorticoids and synthetic analogues, initial encounter: Secondary | ICD-10-CM | POA: Diagnosis not present

## 2019-10-11 DIAGNOSIS — I10 Essential (primary) hypertension: Secondary | ICD-10-CM | POA: Diagnosis present

## 2019-10-11 DIAGNOSIS — E1165 Type 2 diabetes mellitus with hyperglycemia: Secondary | ICD-10-CM | POA: Diagnosis not present

## 2019-10-11 DIAGNOSIS — Z7902 Long term (current) use of antithrombotics/antiplatelets: Secondary | ICD-10-CM

## 2019-10-11 DIAGNOSIS — G510 Bell's palsy: Secondary | ICD-10-CM | POA: Diagnosis not present

## 2019-10-11 DIAGNOSIS — Z79899 Other long term (current) drug therapy: Secondary | ICD-10-CM

## 2019-10-11 DIAGNOSIS — M6281 Muscle weakness (generalized): Secondary | ICD-10-CM | POA: Diagnosis not present

## 2019-10-11 DIAGNOSIS — R0902 Hypoxemia: Secondary | ICD-10-CM | POA: Diagnosis not present

## 2019-10-11 DIAGNOSIS — D500 Iron deficiency anemia secondary to blood loss (chronic): Secondary | ICD-10-CM | POA: Diagnosis present

## 2019-10-11 DIAGNOSIS — E119 Type 2 diabetes mellitus without complications: Secondary | ICD-10-CM | POA: Diagnosis not present

## 2019-10-11 DIAGNOSIS — I2510 Atherosclerotic heart disease of native coronary artery without angina pectoris: Secondary | ICD-10-CM | POA: Diagnosis present

## 2019-10-11 DIAGNOSIS — J1282 Pneumonia due to coronavirus disease 2019: Secondary | ICD-10-CM | POA: Diagnosis present

## 2019-10-11 DIAGNOSIS — J42 Unspecified chronic bronchitis: Secondary | ICD-10-CM | POA: Diagnosis not present

## 2019-10-11 DIAGNOSIS — R0602 Shortness of breath: Secondary | ICD-10-CM | POA: Diagnosis not present

## 2019-10-11 DIAGNOSIS — E785 Hyperlipidemia, unspecified: Secondary | ICD-10-CM | POA: Diagnosis present

## 2019-10-11 DIAGNOSIS — Z87891 Personal history of nicotine dependence: Secondary | ICD-10-CM

## 2019-10-11 DIAGNOSIS — R279 Unspecified lack of coordination: Secondary | ICD-10-CM | POA: Diagnosis not present

## 2019-10-11 DIAGNOSIS — Z7982 Long term (current) use of aspirin: Secondary | ICD-10-CM | POA: Diagnosis not present

## 2019-10-11 DIAGNOSIS — Z7951 Long term (current) use of inhaled steroids: Secondary | ICD-10-CM | POA: Diagnosis not present

## 2019-10-11 DIAGNOSIS — I2583 Coronary atherosclerosis due to lipid rich plaque: Secondary | ICD-10-CM | POA: Diagnosis not present

## 2019-10-11 DIAGNOSIS — J440 Chronic obstructive pulmonary disease with acute lower respiratory infection: Secondary | ICD-10-CM | POA: Diagnosis not present

## 2019-10-11 DIAGNOSIS — Z8249 Family history of ischemic heart disease and other diseases of the circulatory system: Secondary | ICD-10-CM

## 2019-10-11 DIAGNOSIS — Z66 Do not resuscitate: Secondary | ICD-10-CM | POA: Diagnosis not present

## 2019-10-11 DIAGNOSIS — D3500 Benign neoplasm of unspecified adrenal gland: Secondary | ICD-10-CM | POA: Diagnosis present

## 2019-10-11 DIAGNOSIS — Z880 Allergy status to penicillin: Secondary | ICD-10-CM

## 2019-10-11 DIAGNOSIS — U071 COVID-19: Principal | ICD-10-CM | POA: Diagnosis present

## 2019-10-11 DIAGNOSIS — J9601 Acute respiratory failure with hypoxia: Secondary | ICD-10-CM

## 2019-10-11 DIAGNOSIS — Z743 Need for continuous supervision: Secondary | ICD-10-CM | POA: Diagnosis not present

## 2019-10-11 DIAGNOSIS — Z713 Dietary counseling and surveillance: Secondary | ICD-10-CM

## 2019-10-11 DIAGNOSIS — H409 Unspecified glaucoma: Secondary | ICD-10-CM | POA: Diagnosis not present

## 2019-10-11 DIAGNOSIS — Z79890 Hormone replacement therapy (postmenopausal): Secondary | ICD-10-CM

## 2019-10-11 DIAGNOSIS — Z823 Family history of stroke: Secondary | ICD-10-CM

## 2019-10-11 DIAGNOSIS — J189 Pneumonia, unspecified organism: Secondary | ICD-10-CM | POA: Diagnosis not present

## 2019-10-11 DIAGNOSIS — J4520 Mild intermittent asthma, uncomplicated: Secondary | ICD-10-CM | POA: Diagnosis not present

## 2019-10-11 LAB — RESPIRATORY PANEL BY RT PCR (FLU A&B, COVID)
Influenza A by PCR: NEGATIVE
Influenza B by PCR: NEGATIVE
SARS Coronavirus 2 by RT PCR: POSITIVE — AB

## 2019-10-11 LAB — CBC WITH DIFFERENTIAL/PLATELET
Abs Immature Granulocytes: 0.02 10*3/uL (ref 0.00–0.07)
Basophils Absolute: 0 10*3/uL (ref 0.0–0.1)
Basophils Relative: 0 %
Eosinophils Absolute: 0 10*3/uL (ref 0.0–0.5)
Eosinophils Relative: 0 %
HCT: 26 % — ABNORMAL LOW (ref 36.0–46.0)
Hemoglobin: 7.5 g/dL — ABNORMAL LOW (ref 12.0–15.0)
Immature Granulocytes: 0 %
Lymphocytes Relative: 8 %
Lymphs Abs: 0.5 10*3/uL — ABNORMAL LOW (ref 0.7–4.0)
MCH: 20.2 pg — ABNORMAL LOW (ref 26.0–34.0)
MCHC: 28.8 g/dL — ABNORMAL LOW (ref 30.0–36.0)
MCV: 69.9 fL — ABNORMAL LOW (ref 80.0–100.0)
Monocytes Absolute: 0.3 10*3/uL (ref 0.1–1.0)
Monocytes Relative: 5 %
Neutro Abs: 5.3 10*3/uL (ref 1.7–7.7)
Neutrophils Relative %: 87 %
Platelets: 224 10*3/uL (ref 150–400)
RBC: 3.72 MIL/uL — ABNORMAL LOW (ref 3.87–5.11)
RDW: 20.1 % — ABNORMAL HIGH (ref 11.5–15.5)
WBC: 6.1 10*3/uL (ref 4.0–10.5)
nRBC: 0.3 % — ABNORMAL HIGH (ref 0.0–0.2)

## 2019-10-11 LAB — COMPREHENSIVE METABOLIC PANEL
ALT: 14 U/L (ref 0–44)
AST: 31 U/L (ref 15–41)
Albumin: 3 g/dL — ABNORMAL LOW (ref 3.5–5.0)
Alkaline Phosphatase: 82 U/L (ref 38–126)
Anion gap: 8 (ref 5–15)
BUN: 17 mg/dL (ref 8–23)
CO2: 24 mmol/L (ref 22–32)
Calcium: 7.7 mg/dL — ABNORMAL LOW (ref 8.9–10.3)
Chloride: 105 mmol/L (ref 98–111)
Creatinine, Ser: 1.33 mg/dL — ABNORMAL HIGH (ref 0.44–1.00)
GFR calc Af Amer: 42 mL/min — ABNORMAL LOW (ref >60)
GFR calc non Af Amer: 36 mL/min — ABNORMAL LOW (ref >60)
Glucose, Bld: 143 mg/dL — ABNORMAL HIGH (ref 70–99)
Potassium: 3.5 mmol/L (ref 3.5–5.1)
Sodium: 137 mmol/L (ref 135–145)
Total Bilirubin: 0.6 mg/dL (ref 0.3–1.2)
Total Protein: 6.6 g/dL (ref 6.5–8.1)

## 2019-10-11 LAB — GLUCOSE, CAPILLARY
Glucose-Capillary: 119 mg/dL — ABNORMAL HIGH (ref 70–99)
Glucose-Capillary: 189 mg/dL — ABNORMAL HIGH (ref 70–99)

## 2019-10-11 LAB — D-DIMER, QUANTITATIVE: D-Dimer, Quant: 1.65 ug/mL-FEU — ABNORMAL HIGH (ref 0.00–0.50)

## 2019-10-11 LAB — LACTIC ACID, PLASMA
Lactic Acid, Venous: 2.2 mmol/L (ref 0.5–1.9)
Lactic Acid, Venous: 2 mmol/L (ref 0.5–1.9)

## 2019-10-11 LAB — TROPONIN I (HIGH SENSITIVITY)
Troponin I (High Sensitivity): 14 ng/L (ref <18)
Troponin I (High Sensitivity): 13 ng/L (ref <18)

## 2019-10-11 LAB — HEMOGLOBIN A1C
Hgb A1c MFr Bld: 6.6 % — ABNORMAL HIGH (ref 4.8–5.6)
Mean Plasma Glucose: 142.72 mg/dL

## 2019-10-11 LAB — PROCALCITONIN: Procalcitonin: 0.26 ng/mL

## 2019-10-11 LAB — BRAIN NATRIURETIC PEPTIDE: B Natriuretic Peptide: 76.4 pg/mL (ref 0.0–100.0)

## 2019-10-11 LAB — FIBRINOGEN: Fibrinogen: 482 mg/dL — ABNORMAL HIGH (ref 210–475)

## 2019-10-11 LAB — TRIGLYCERIDES: Triglycerides: 78 mg/dL (ref <150)

## 2019-10-11 LAB — LACTATE DEHYDROGENASE: LDH: 406 U/L — ABNORMAL HIGH (ref 98–192)

## 2019-10-11 LAB — FERRITIN: Ferritin: 24 ng/mL (ref 11–307)

## 2019-10-11 LAB — C-REACTIVE PROTEIN: CRP: 12.8 mg/dL — ABNORMAL HIGH (ref <1.0)

## 2019-10-11 MED ORDER — IRBESARTAN 300 MG PO TABS
150.0000 mg | ORAL_TABLET | Freq: Every day | ORAL | Status: DC
  Administered 2019-10-11 – 2019-10-14 (×4): 150 mg via ORAL
  Filled 2019-10-11 (×4): qty 1

## 2019-10-11 MED ORDER — SODIUM CHLORIDE 0.9 % IV SOLN
100.0000 mg | Freq: Every day | INTRAVENOUS | Status: AC
  Administered 2019-10-12 – 2019-10-15 (×4): 100 mg via INTRAVENOUS
  Filled 2019-10-11 (×4): qty 20

## 2019-10-11 MED ORDER — SODIUM CHLORIDE 0.9 % IV SOLN: 250.0000 mL | INTRAVENOUS | Status: DC | PRN

## 2019-10-11 MED ORDER — OXYCODONE HCL 5 MG PO TABS: 5.0000 mg | ORAL_TABLET | ORAL | Status: DC | PRN

## 2019-10-11 MED ORDER — SODIUM CHLORIDE 0.9 % IV SOLN
200.0000 mg | Freq: Once | INTRAVENOUS | Status: AC
  Administered 2019-10-11: 200 mg via INTRAVENOUS
  Filled 2019-10-11: qty 200

## 2019-10-11 MED ORDER — ONDANSETRON HCL 4 MG/2ML IJ SOLN
4.0000 mg | Freq: Four times a day (QID) | INTRAMUSCULAR | Status: DC | PRN
  Administered 2019-10-13: 4 mg via INTRAVENOUS
  Filled 2019-10-11: qty 2

## 2019-10-11 MED ORDER — BRIMONIDINE TARTRATE 0.15 % OP SOLN
1.0000 [drp] | Freq: Three times a day (TID) | OPHTHALMIC | Status: DC
  Administered 2019-10-11 – 2019-10-24 (×40): 1 [drp] via OPHTHALMIC
  Filled 2019-10-11 (×2): qty 5

## 2019-10-11 MED ORDER — DORZOLAMIDE HCL 2 % OP SOLN
1.0000 [drp] | Freq: Three times a day (TID) | OPHTHALMIC | Status: DC
  Administered 2019-10-11 – 2019-10-24 (×41): 1 [drp] via OPHTHALMIC
  Filled 2019-10-11: qty 10

## 2019-10-11 MED ORDER — ONDANSETRON HCL 4 MG PO TABS: 4.0000 mg | ORAL_TABLET | Freq: Four times a day (QID) | ORAL | Status: DC | PRN

## 2019-10-11 MED ORDER — INSULIN ASPART 100 UNIT/ML ~~LOC~~ SOLN
0.0000 [IU] | Freq: Three times a day (TID) | SUBCUTANEOUS | Status: DC
  Administered 2019-10-12 – 2019-10-13 (×5): 3 [IU] via SUBCUTANEOUS
  Administered 2019-10-13 – 2019-10-14 (×3): 5 [IU] via SUBCUTANEOUS
  Administered 2019-10-14: 8 [IU] via SUBCUTANEOUS
  Administered 2019-10-15: 3 [IU] via SUBCUTANEOUS
  Administered 2019-10-15: 8 [IU] via SUBCUTANEOUS
  Administered 2019-10-15 – 2019-10-16 (×2): 3 [IU] via SUBCUTANEOUS
  Administered 2019-10-16 (×2): 5 [IU] via SUBCUTANEOUS
  Administered 2019-10-17: 8 [IU] via SUBCUTANEOUS
  Administered 2019-10-17: 5 [IU] via SUBCUTANEOUS
  Administered 2019-10-17: 3 [IU] via SUBCUTANEOUS
  Administered 2019-10-18 – 2019-10-19 (×5): 5 [IU] via SUBCUTANEOUS
  Administered 2019-10-19: 8 [IU] via SUBCUTANEOUS
  Administered 2019-10-20 – 2019-10-21 (×5): 5 [IU] via SUBCUTANEOUS
  Administered 2019-10-21: 3 [IU] via SUBCUTANEOUS
  Administered 2019-10-22: 5 [IU] via SUBCUTANEOUS
  Administered 2019-10-22 – 2019-10-24 (×8): 3 [IU] via SUBCUTANEOUS

## 2019-10-11 MED ORDER — MOMETASONE FURO-FORMOTEROL FUM 200-5 MCG/ACT IN AERO
2.0000 | INHALATION_SPRAY | Freq: Two times a day (BID) | RESPIRATORY_TRACT | Status: DC
  Administered 2019-10-11 – 2019-10-24 (×27): 2 via RESPIRATORY_TRACT
  Filled 2019-10-11: qty 8.8

## 2019-10-11 MED ORDER — ALBUTEROL SULFATE HFA 108 (90 BASE) MCG/ACT IN AERS
2.0000 | INHALATION_SPRAY | RESPIRATORY_TRACT | Status: DC | PRN
  Filled 2019-10-11: qty 6.7

## 2019-10-11 MED ORDER — SODIUM CHLORIDE 0.9% FLUSH: 3.0000 mL | INTRAVENOUS | Status: DC | PRN

## 2019-10-11 MED ORDER — GUAIFENESIN-DM 100-10 MG/5ML PO SYRP
10.0000 mL | ORAL_SOLUTION | ORAL | Status: DC | PRN
  Administered 2019-10-16 – 2019-10-18 (×3): 10 mL via ORAL
  Filled 2019-10-11 (×3): qty 10

## 2019-10-11 MED ORDER — CLOPIDOGREL BISULFATE 75 MG PO TABS
75.0000 mg | ORAL_TABLET | Freq: Every day | ORAL | Status: DC
  Administered 2019-10-11 – 2019-10-24 (×14): 75 mg via ORAL
  Filled 2019-10-11 (×15): qty 1

## 2019-10-11 MED ORDER — HYDROCOD POLST-CPM POLST ER 10-8 MG/5ML PO SUER: 5.0000 mL | Freq: Two times a day (BID) | ORAL | Status: DC | PRN

## 2019-10-11 MED ORDER — ASPIRIN EC 81 MG PO TBEC
81.0000 mg | DELAYED_RELEASE_TABLET | Freq: Every day | ORAL | Status: DC
  Administered 2019-10-11 – 2019-10-24 (×14): 81 mg via ORAL
  Filled 2019-10-11 (×15): qty 1

## 2019-10-11 MED ORDER — ALBUTEROL SULFATE HFA 108 (90 BASE) MCG/ACT IN AERS
4.0000 | INHALATION_SPRAY | Freq: Once | RESPIRATORY_TRACT | Status: AC
  Administered 2019-10-11: 4 via RESPIRATORY_TRACT
  Filled 2019-10-11: qty 6.7

## 2019-10-11 MED ORDER — BISACODYL 5 MG PO TBEC: 5.0000 mg | DELAYED_RELEASE_TABLET | Freq: Every day | ORAL | Status: DC | PRN

## 2019-10-11 MED ORDER — ACETAMINOPHEN 325 MG PO TABS: 650.0000 mg | ORAL_TABLET | Freq: Four times a day (QID) | ORAL | Status: DC | PRN

## 2019-10-11 MED ORDER — SODIUM CHLORIDE 0.9 % IV SOLN: Freq: Once | INTRAVENOUS | Status: DC

## 2019-10-11 MED ORDER — DEXAMETHASONE SODIUM PHOSPHATE 10 MG/ML IJ SOLN
6.0000 mg | Freq: Once | INTRAMUSCULAR | Status: AC
  Administered 2019-10-11: 6 mg via INTRAVENOUS
  Filled 2019-10-11: qty 1

## 2019-10-11 MED ORDER — POLYETHYLENE GLYCOL 3350 17 G PO PACK: 17.0000 g | PACK | Freq: Every day | ORAL | Status: DC | PRN

## 2019-10-11 MED ORDER — FLEET ENEMA 7-19 GM/118ML RE ENEM: 1.0000 | ENEMA | Freq: Once | RECTAL | Status: DC | PRN

## 2019-10-11 MED ORDER — SODIUM CHLORIDE 0.9% FLUSH
3.0000 mL | Freq: Two times a day (BID) | INTRAVENOUS | Status: DC
  Administered 2019-10-11 – 2019-10-24 (×28): 3 mL via INTRAVENOUS

## 2019-10-11 MED ORDER — DEXAMETHASONE SODIUM PHOSPHATE 10 MG/ML IJ SOLN
6.0000 mg | INTRAMUSCULAR | Status: DC
  Administered 2019-10-12: 6 mg via INTRAVENOUS
  Filled 2019-10-11: qty 1

## 2019-10-11 MED ORDER — LATANOPROST 0.005 % OP SOLN
1.0000 [drp] | Freq: Every day | OPHTHALMIC | Status: DC
  Administered 2019-10-11 – 2019-10-24 (×14): 1 [drp] via OPHTHALMIC
  Filled 2019-10-11: qty 2.5

## 2019-10-11 MED ORDER — ENOXAPARIN SODIUM 40 MG/0.4ML ~~LOC~~ SOLN
40.0000 mg | SUBCUTANEOUS | Status: DC
  Administered 2019-10-11: 40 mg via SUBCUTANEOUS
  Filled 2019-10-11: qty 0.4

## 2019-10-11 MED ORDER — MONTELUKAST SODIUM 10 MG PO TABS
10.0000 mg | ORAL_TABLET | Freq: Every day | ORAL | Status: DC
  Administered 2019-10-11 – 2019-10-24 (×14): 10 mg via ORAL
  Filled 2019-10-11 (×15): qty 1

## 2019-10-11 NOTE — Progress Notes (Signed)
Debra Acosta [id number] Admission Data: 10/11/2019 5:04 PM Attending Provider: Karmen Bongo, MD  [id number], Myra Rude, MD Consults/ Treatment Team:   Debra Acosta is a 86 y.o. female patient admitted from ED awake, alert  & orientated  X 3,  DNR, VSS - Blood pressure (!) 103/55, pulse 83, temperature 98 F (36.7 C), temperature source Oral, resp. rate (!) 23, height 5\' 5"  (1.651 m), weight 129.3 kg, SpO2 93 %., O2    4 L nasal cannular, no c/o shortness of breath, no c/o chest pain, no distress noted. Tele # 20 placed and pt is currently running:normal sinus rhythm.   IV site WDL:  antecubital right, condition patent and no redness with a transparent dsg that's clean dry and intact.  Allergies:   Allergies  Allergen Reactions  . Penicillins Other (See Comments)    Unknown      Past Medical History:  Diagnosis Date  . Bell's palsy    Left facial droop  . Coronary artery disease    S/P PCI of the LAD, OM2, and circumflex  . Diabetes mellitus without complication ([place])   . Dyslipidemia   . Hypertension       Pt orientation to unit, room and routine. Information packet given to patient/family and safety video watched.  Admission INP armband ID verified with patient/family, and in place. SR up x 2, fall risk assessment complete with Patient and family verbalizing understanding of risks associated with falls. Pt verbalizes an understanding of how to use the call bell and to call for help before getting out of bed.  Skin, clean-dry- intact without evidence of bruising, or skin tears.   No evidence of skin break down noted on exam.     Will cont to monitor and assist as needed.  Debra Acosta Shelda Pal, RN 10/11/2019 5:04 PM

## 2019-10-11 NOTE — ED Provider Notes (Signed)
[place] EMERGENCY DEPARTMENT Provider Note   CSN: [id number] Arrival date & time: 10/11/19  1129     History Chief Complaint  Patient presents with  . Shortness of Breath    Debra Acosta is a 86 y.o. female.  She has a history of hypertension diabetes and coronary disease.  She is complaining of shortness of breath that began 1 week ago.  No other symptoms including no cough fevers chills headache body aches diarrhea loss of smell or taste.  No sick contacts or recent travel.  She said she went to her primary care doctor's office today who found her oxygen to be in the 60s and 70%.  No prior history of hypoxia.  Using inhaler without any improvement.  The history is provided by the patient.  Shortness of Breath Severity:  Moderate Onset quality:  Gradual Duration:  1 week Timing:  Intermittent Progression:  Unchanged Chronicity:  New Context: activity   Relieved by:  Nothing Worsened by:  Activity Ineffective treatments:  Rest Associated symptoms: chest pain and wheezing   Associated symptoms: no abdominal pain, no cough, no fever, no headaches, no hemoptysis, no rash, no sore throat, no sputum production, no syncope and no vomiting        Past Medical History:  Diagnosis Date  . Bell's palsy    Left facial droop  . Coronary artery disease    S/P PCI of the LAD, OM2, and circumflex  . Diabetes mellitus without complication ([place])   . Dyslipidemia   . Hypertension     Patient Active Problem List   Diagnosis Date Noted  . Morbid obesity ([place]) 07/07/2013  . DM2 (diabetes mellitus, type 2) ([place]) 07/07/2013  . Dyslipidemia 07/07/2013  . Bell's palsy 07/07/2013  . CAD (coronary artery disease) 07/07/2013  . Chest pain 07/07/2013  . DOE (dyspnea on exertion) 07/07/2013    Past Surgical History:  Procedure Laterality Date  . 2D ECHOCARDIOGRAM  07/12/2010   EF >55%, normal-mild  . CARDIAC CATHETERIZATION  10/10/1999   L Circumflex 99%  stenosis stented with a 3.0x44m [id number] stent, resulting in reduction from 99% to 0% residual  . CARDIAC CATHETERIZATION  08/11/2000   L Circumflex 85% in-stent restenosis, intervention done with a 3.5x160mcutting balloon inflated at 9 atm, resulting in reduction of 85% restenosis to 10% w/TIMI 3 flow  . CARDIOVASCULAR STRESS TEST  06/23/2006   Dobutamine. No ECG changes, essentially a low risk scan.     OB History   No obstetric history on file.     Family History  Problem Relation Age of Onset  . Heart disease Sister   . Stroke Brother     Social History   Tobacco Use  . Smoking status: Former Smoker    Years: 24.00    Quit date: 06/24/1976    Years since quitting: 43.3  . Smokeless tobacco: Never Used  . Tobacco comment: "when I was younger ([age])"  Substance Use Topics  . Alcohol use: No  . Drug use: No    Home Medications Prior to Admission medications   Medication Sig Start Date End Date Taking? Authorizing Provider  aspirin EC 81 MG tablet Take 81 mg by mouth daily.    [provider]  brimonidine (ALPHAGAN P) 0.1 % SOLN Place 1 drop into both eyes 3 (three) times daily.    [provider]  clopidogrel (PLAVIX) 75 MG tablet Take 75 mg by mouth daily.    [provider]  docusate sodium (COLACE) 100 MG capsule Take 1 capsule (100 mg total) by mouth every 12 (twelve) hours. 04/15/16   Ward, Delice Bison, DO  dorzolamide (TRUSOPT) 2 % ophthalmic solution Place 1 drop into both eyes 3 (three) times daily.    [provider]  Fluticasone-Salmeterol (ADVAIR) 250-50 MCG/DOSE AEPB Inhale 1 puff into the lungs daily.    [provider]  irbesartan (AVAPRO) 150 MG tablet Take 150 mg by mouth daily. 04/07/16   [provider]  latanoprost (XALATAN) 0.005 % ophthalmic solution Place 1 drop into both eyes at bedtime.    [provider]  metFORMIN (GLUCOPHAGE) 500 MG tablet Take 1,000 mg by mouth at bedtime.    [provider]  nitroGLYCERIN (NITROSTAT) 0.4 MG SL tablet Place 0.4 mg under the tongue every 5 (five) minutes as needed for chest pain.    [provider]  oxyCODONE-acetaminophen (PERCOCET/ROXICET) 5-325 MG tablet Take 1-2 tablets by mouth every 6 (six) hours as needed. 04/15/16   Ward, Delice Bison, DO  rosuvastatin (CRESTOR) 20 MG tablet Take 20 mg by mouth at bedtime.    [provider]    Allergies    Penicillins  Review of Systems   Review of Systems  Constitutional: Negative for fever.  HENT: Negative for sore throat.   Eyes: Negative for visual disturbance.  Respiratory: Positive for shortness of breath and wheezing. Negative for cough, hemoptysis and sputum production.   Cardiovascular: Positive for chest pain. Negative for leg swelling and syncope.  Gastrointestinal: Negative for abdominal pain and vomiting.  Genitourinary: Negative for dysuria.  Musculoskeletal: Positive for arthralgias (knees arthritis).  Skin: Negative for rash.  Neurological: Negative for headaches.    Physical Exam Updated Vital Signs BP (!) 131/48 (BP Location: Right Arm)   Pulse 87   Temp 98.2 F (36.8 C) (Oral)   Resp (!) 24   Ht '5\' 5"'  (1.651 m)   Wt 129.3 kg   SpO2 (!) 73%   BMI 47.43 kg/m   Physical Exam Vitals and nursing note reviewed.  Constitutional:      General: She is not in acute distress.    Appearance: She is well-developed.  HENT:     Head: Normocephalic and atraumatic.  Eyes:     Conjunctiva/sclera: Conjunctivae normal.  Cardiovascular:     Rate and Rhythm: Normal rate and regular rhythm.     Heart sounds: No murmur.  Pulmonary:     Effort: Pulmonary effort is normal. Tachypnea present. No respiratory distress.     Breath sounds: No stridor. Wheezing (few scattered) present.  Abdominal:     Palpations: Abdomen is soft.     Tenderness: There is no abdominal tenderness.  Musculoskeletal:        General: No tenderness. Normal range of motion.      Cervical back: Neck supple.     Right lower leg: No edema.     Left lower leg: No edema.  Skin:    General: Skin is warm and dry.     Capillary Refill: Capillary refill takes less than 2 seconds.  Neurological:     General: No focal deficit present.     Mental Status: She is alert.     GCS: GCS eye subscore is 4. GCS verbal subscore is 5. GCS motor subscore is 6.     ED Results / Procedures / Treatments   Labs (all labs ordered are listed, but only abnormal results are displayed) Labs Reviewed  RESPIRATORY PANEL  BY RT PCR (FLU A&B, COVID) - Abnormal; Notable for the following components:      Result Value   SARS Coronavirus 2 by RT PCR POSITIVE (*)    All other components within normal limits  LACTIC ACID, PLASMA - Abnormal; Notable for the following components:   Lactic Acid, Venous 2.2 (*)    All other components within normal limits  LACTIC ACID, PLASMA - Abnormal; Notable for the following components:   Lactic Acid, Venous 2.0 (*)    All other components within normal limits  CBC WITH DIFFERENTIAL/PLATELET - Abnormal; Notable for the following components:   RBC 3.72 (*)    Hemoglobin 7.5 (*)    HCT 26.0 (*)    MCV 69.9 (*)    MCH 20.2 (*)    MCHC 28.8 (*)    RDW 20.1 (*)    nRBC 0.3 (*)    Lymphs Abs 0.5 (*)    All other components within normal limits  COMPREHENSIVE METABOLIC PANEL - Abnormal; Notable for the following components:   Glucose, Bld 143 (*)    Creatinine, Ser 1.33 (*)    Calcium 7.7 (*)    Albumin 3.0 (*)    GFR calc non Af Amer 36 (*)    GFR calc Af Amer 42 (*)    All other components within normal limits  D-DIMER, QUANTITATIVE (NOT AT [facility]) - Abnormal; Notable for the following components:   D-Dimer, Quant 1.65 (*)    All other components within normal limits  LACTATE DEHYDROGENASE - Abnormal; Notable for the following components:   LDH 406 (*)    All other components within normal limits  FIBRINOGEN - Abnormal; Notable for the following  components:   Fibrinogen 482 (*)    All other components within normal limits  C-REACTIVE PROTEIN - Abnormal; Notable for the following components:   CRP 12.8 (*)    All other components within normal limits  HEMOGLOBIN A1C - Abnormal; Notable for the following components:   Hgb A1c MFr Bld 6.6 (*)    All other components within normal limits  GLUCOSE, CAPILLARY - Abnormal; Notable for the following components:   Glucose-Capillary 119 (*)    All other components within normal limits  CULTURE, BLOOD (ROUTINE X 2)  CULTURE, BLOOD (ROUTINE X 2)  PROCALCITONIN  FERRITIN  BRAIN NATRIURETIC PEPTIDE  TRIGLYCERIDES  CBC WITH DIFFERENTIAL/PLATELET  COMPREHENSIVE METABOLIC PANEL  C-REACTIVE PROTEIN  D-DIMER, QUANTITATIVE (NOT AT [facility])  FERRITIN  MAGNESIUM  PHOSPHORUS  TROPONIN I (HIGH SENSITIVITY)  TROPONIN I (HIGH SENSITIVITY)    EKG EKG Interpretation  Date/Time:  Tuesday October 11 2019 11:52:40 EST Ventricular Rate:  86 PR Interval:    QRS Duration: 125 QT Interval:  398 QTC Calculation: 476 R Axis:   -22 Text Interpretation: Sinus rhythm Ventricular premature complex Nonspecific intraventricular conduction delay IVCD new since prior 12/06 Confirmed by Aletta Edouard [phone]) on 10/11/2019 11:58:12 AM   Radiology DG Chest Port 1 View  Result Date: 10/11/2019 CLINICAL DATA:  Shortness of breath EXAM: PORTABLE CHEST 1 VIEW COMPARISON:  08/11/2005 FINDINGS: Mild cardiomegaly. Aortic atherosclerosis and tortuosity. Widespread hazy pulmonary infiltrates worrisome for viral pneumonia. The differential diagnosis is fluid overload/early congestive heart failure. No visible effusion. No acute bone finding. IMPRESSION: Widespread hazy pulmonary infiltrates worrisome for viral pneumonia. Differential diagnosis is fluid overload/CHF. Electronically Signed   By: Nelson Chimes M.D.   On: 10/11/2019 12:36    Procedures .Critical Care Performed by: Hayden Rasmussen, MD Authorized by:  Hayden Rasmussen, MD   Critical care provider statement:    Critical care time (minutes):  45   Critical care was necessary to treat or prevent imminent or life-threatening deterioration of the following conditions:  Respiratory failure   Critical care was time spent personally by me on the following activities:  Discussions with consultants, evaluation of patient's response to treatment, examination of patient, ordering and performing treatments and interventions, ordering and review of laboratory studies, ordering and review of radiographic studies, pulse oximetry, re-evaluation of patient's condition, obtaining history from patient or surrogate, review of old charts and development of treatment plan with patient or surrogate   I assumed direction of critical care for this patient from another provider in my specialty: no     (including critical care time)  Medications Ordered in ED Medications  aspirin EC tablet 81 mg (81 mg Oral Given 10/11/19 1557)  irbesartan (AVAPRO) tablet 150 mg (150 mg Oral Given 10/11/19 1558)  clopidogrel (PLAVIX) tablet 75 mg (75 mg Oral Given 10/11/19 1558)  montelukast (SINGULAIR) tablet 10 mg (has no administration in time range)  mometasone-formoterol (DULERA) 200-5 MCG/ACT inhaler 2 puff (has no administration in time range)  brimonidine (ALPHAGAN) 0.15 % ophthalmic solution 1 drop (1 drop Both Eyes Given 10/11/19 1602)  dorzolamide (TRUSOPT) 2 % ophthalmic solution 1 drop (1 drop Both Eyes Given 10/11/19 1603)  latanoprost (XALATAN) 0.005 % ophthalmic solution 1 drop (has no administration in time range)  enoxaparin (LOVENOX) injection 40 mg (40 mg Subcutaneous Given 10/11/19 1554)  sodium chloride flush (NS) 0.9 % injection 3 mL (3 mLs Intravenous Given 10/11/19 1559)  sodium chloride flush (NS) 0.9 % injection 3 mL (has no administration in time range)  0.9 %  sodium chloride infusion (has no administration in time range)  remdesivir 200 mg in sodium chloride  0.9% 250 mL IVPB (0 mg Intravenous Stopped 10/11/19 1703)    Followed by  remdesivir 100 mg in sodium chloride 0.9 % 100 mL IVPB (has no administration in time range)  albuterol (VENTOLIN HFA) 108 (90 Base) MCG/ACT inhaler 2 puff (has no administration in time range)  guaiFENesin-dextromethorphan (ROBITUSSIN DM) 100-10 MG/5ML syrup 10 mL (has no administration in time range)  chlorpheniramine-HYDROcodone (TUSSIONEX) 10-8 MG/5ML suspension 5 mL (has no administration in time range)  acetaminophen (TYLENOL) tablet 650 mg (has no administration in time range)  oxyCODONE (Oxy IR/ROXICODONE) immediate release tablet 5 mg (has no administration in time range)  polyethylene glycol (MIRALAX / GLYCOLAX) packet 17 g (has no administration in time range)  bisacodyl (DULCOLAX) EC tablet 5 mg (has no administration in time range)  sodium phosphate (FLEET) 7-19 GM/118ML enema 1 enema (has no administration in time range)  ondansetron (ZOFRAN) tablet 4 mg (has no administration in time range)    Or  ondansetron (ZOFRAN) injection 4 mg (has no administration in time range)  insulin aspart (novoLOG) injection 0-15 Units (0 Units Subcutaneous Not Given 10/11/19 1657)  dexamethasone (DECADRON) injection 6 mg (has no administration in time range)  albuterol (VENTOLIN HFA) 108 (90 Base) MCG/ACT inhaler 4 puff (4 puffs Inhalation Given 10/11/19 1553)  dexamethasone (DECADRON) injection 6 mg (6 mg Intravenous Given 10/11/19 1554)    ED Course  I have reviewed the triage vital signs and the nursing notes.  Pertinent labs & imaging results that were available during my care of the patient were reviewed by me and considered in my medical decision making (see chart for details).  Clinical Course as of  Oct 10 1337  Tue Jan 26, 706  2376 86 year old female here with shortness of breath and hypoxia.  Differential includes Covid, pneumonia, CHF, ACS, pneumothorax   [MB]  1230 Chest x-ray interpreted by me is likely  multifocal pneumonia.  Awaiting radiology reading.   [MB]  1301 Patient's lactate elevated 2.2.  I do not think is sepsis but more likely due to her work of breathing and likely Covid infection.  Will order some maintenance fluids.   [MB]    Clinical Course User Index [MB] Hayden Rasmussen, MD   MDM Rules/Calculators/A&P                     Debra Acosta was evaluated in Emergency Department on 10/11/2019 for the symptoms described in the history of present illness. She was evaluated in the context of the global COVID-19 pandemic, which necessitated consideration that the patient might be at risk for infection with the SARS-CoV-2 virus that causes COVID-19. Institutional protocols and algorithms that pertain to the evaluation of patients at risk for COVID-19 are in a state of rapid change based on information released by regulatory bodies including the CDC and federal and state organizations. These policies and algorithms were followed during the patient's care in the ED.  Final Clinical Impression(s) / ED Diagnoses Final diagnoses:  Acute hypoxemic respiratory failure due to COVID-19 [facility]    Rx / DC Orders ED Discharge Orders    None       Hayden Rasmussen, MD 10/11/19 1740

## 2019-10-11 NOTE — ED Notes (Signed)
Got patient on the monitor did ekg shown to Dr Melina Copa patient is resting with csll bell in reach

## 2019-10-11 NOTE — Progress Notes (Signed)
Patient tolerated proning position  well times two assist turning, on 4L N/C. Will continue to monitor.

## 2019-10-11 NOTE — ED Triage Notes (Signed)
Pt reports SOB since last Tues, denies recent cough, fever. Appears SOB in triage. Does not normally wear O2. Alert, oriented x4.

## 2019-10-11 NOTE — H&P (Signed)
History and Physical    Debra Acosta EXB:[id number] DOB: 03-10-1931 DOA: 10/11/2019  PCP: Lucianne Lei, MD Consultants:  None Patient coming from: Home - lives with ; NOKKem Kays [place], [phone]  Chief Complaint:  SOB  HPI: Debra Acosta is a 86 y.o. female with medical history significant of HTN; HLD; DM; morbid obesity (BMI 47); and CAD s/p stents presenting with SOB.  She lives with her son, who moves furniture.  I spoke with her older son: They ate on Sunday and it took her longer to get around than usual.   He was concerned about this and thinks that she got the virus from her younger son last week.  The patient reports that she has been short-winded for about a week.  She went to her PCP today and was told to come to the ER for further evaluation.  She reports that she "just has a bronchitis or something."  She denies URI symptoms other than SOB.  She thinks she may have lost her sense of taste.  She has noticed some chest discomfort with the SOB.  No GI symptoms.    ED Course:  Hypoxia, COVID positive.  SOB x 1 week.  O2 sats in 60s.  Multifocal PNA on O2.  On 4L O2.  Review of Systems: As per HPI; otherwise review of systems reviewed and negative.   Ambulatory Status:  Ambulates without assistance  Past Medical History:  Diagnosis Date  . Bell's palsy    Left facial droop  . Coronary artery disease    S/P PCI of the LAD, OM2, and circumflex  . Diabetes mellitus without complication ([place])   . Dyslipidemia   . Hypertension     Past Surgical History:  Procedure Laterality Date  . 2D ECHOCARDIOGRAM  07/12/2010   EF >55%, normal-mild  . CARDIAC CATHETERIZATION  10/10/1999   L Circumflex 99% stenosis stented with a 3.0x17m [id number] stent, resulting in reduction from 99% to 0% residual  . CARDIAC CATHETERIZATION  08/11/2000   L Circumflex 85% in-stent restenosis, intervention done with a 3.5x135mcutting balloon inflated at 9 atm, resulting in reduction of  85% restenosis to 10% w/TIMI 3 flow  . CARDIOVASCULAR STRESS TEST  06/23/2006   Dobutamine. No ECG changes, essentially a low risk scan.    Social History   Socioeconomic History  . Marital status: Married    Spouse name: Not on file  . Number of children: Not on file  . Years of education: Not on file  . Highest education level: Not on file  Occupational History  . Not on file  Tobacco Use  . Smoking status: Former Smoker    Years: 24.00    Quit date: 06/24/1976    Years since quitting: 43.3  . Smokeless tobacco: Never Used  . Tobacco comment: "when I was younger ([age])"  Substance and Sexual Activity  . Alcohol use: No  . Drug use: No  . Sexual activity: Not on file  Other Topics Concern  . Not on file  Social History Narrative  . Not on file   Social Determinants of Health   Financial Resource Strain:   . Difficulty of Paying Living Expenses: Not on file  Food Insecurity:   . Worried About RuCharity fundraisern the Last Year: Not on file  . Ran Out of Food in the Last Year: Not on file  Transportation Needs:   . Lack of Transportation (Medical): Not on file  . Lack  of Transportation (Non-Medical): Not on file  Physical Activity:   . Days of Exercise per Week: Not on file  . Minutes of Exercise per Session: Not on file  Stress:   . Feeling of Stress : Not on file  Social Connections:   . Frequency of Communication with Friends and Family: Not on file  . Frequency of Social Gatherings with Friends and Family: Not on file  . Attends Religious Services: Not on file  . Active Member of Clubs or Organizations: Not on file  . Attends Archivist Meetings: Not on file  . Marital Status: Not on file  Intimate Partner Violence:   . Fear of Current or Ex-Partner: Not on file  . Emotionally Abused: Not on file  . Physically Abused: Not on file  . Sexually Abused: Not on file    Allergies  Allergen Reactions  . Penicillins Other (See Comments)     Unknown     Family History  Problem Relation Age of Onset  . Heart disease Sister   . Stroke Brother     Prior to Admission medications   Medication Sig Start Date End Date Taking? Authorizing Provider  aspirin EC 81 MG tablet Take 81 mg by mouth daily.   Yes [provider]  brimonidine (ALPHAGAN P) 0.1 % SOLN Place 1 drop into both eyes 3 (three) times daily.   Yes [provider]  clopidogrel (PLAVIX) 75 MG tablet Take 75 mg by mouth daily.   Yes [provider]  docusate sodium (COLACE) 100 MG capsule Take 1 capsule (100 mg total) by mouth every 12 (twelve) hours. Patient taking differently: Take 100 mg by mouth 2 (two) times daily as needed for mild constipation.  04/15/16  Yes Ward, Kristen N, DO  dorzolamide (TRUSOPT) 2 % ophthalmic solution Place 1 drop into both eyes 3 (three) times daily.   Yes [provider]  Fluticasone-Salmeterol (ADVAIR) 250-50 MCG/DOSE AEPB Inhale 1 puff into the lungs daily.   Yes [provider]  irbesartan (AVAPRO) 150 MG tablet Take 150 mg by mouth daily. 04/07/16  Yes [provider]  latanoprost (XALATAN) 0.005 % ophthalmic solution Place 1 drop into both eyes at bedtime.   Yes [provider]  metFORMIN (GLUCOPHAGE) 500 MG tablet Take 1,000 mg by mouth at bedtime.   Yes [provider]  montelukast (SINGULAIR) 10 MG tablet Take 10 mg by mouth at bedtime. 09/29/19  Yes [provider]  nitroGLYCERIN (NITROSTAT) 0.4 MG SL tablet Place 0.4 mg under the tongue every 5 (five) minutes as needed for chest pain.   Yes [provider]  oxyCODONE-acetaminophen (PERCOCET/ROXICET) 5-325 MG tablet Take 1-2 tablets by mouth every 6 (six) hours as needed. Patient not taking: Reported on 10/11/2019 04/15/16   Ward, Delice Bison, DO    Physical Exam: Vitals:   10/11/19 1430 10/11/19 1500 10/11/19 1530 10/11/19 1635  BP: (!) 132/58 138/64 (!) 143/58 (!) 103/55  Pulse:    83  Resp:  (!) 23 (!) 21 (!) 23 (!) 23  Temp:    98 F (36.7 C)  TempSrc:    Oral  SpO2:    93%  Weight:      Height:         . General:  Appears calm and comfortable and is NAD . Eyes:  PERRL, EOMI, normal lids, iris . ENT:  Hard of hearing, grossly normal lips & tongue, mmm . Neck:  no LAD, masses or thyromegaly .  Cardiovascular:  RRR, no m/r/g. No LE edema.  Marland Kitchen Respiratory:   Scattered rhonchi.  Mildly increased respiratory effort. . Abdomen:  soft, NT, ND, NABS . Back:   normal alignment, no CVAT . Skin:  no rash or induration seen on limited exam . Musculoskeletal:  grossly normal tone BUE/BLE, good ROM, no bony abnormality . Psychiatric:  grossly normal mood and affect, speech fluent and appropriate, AOx3 . Neurologic:  CN 2-12 grossly intact, moves all extremities in coordinated fashion    Radiological Exams on Admission: DG Chest Port 1 View  Result Date: 10/11/2019 CLINICAL DATA:  Shortness of breath EXAM: PORTABLE CHEST 1 VIEW COMPARISON:  08/11/2005 FINDINGS: Mild cardiomegaly. Aortic atherosclerosis and tortuosity. Widespread hazy pulmonary infiltrates worrisome for viral pneumonia. The differential diagnosis is fluid overload/early congestive heart failure. No visible effusion. No acute bone finding. IMPRESSION: Widespread hazy pulmonary infiltrates worrisome for viral pneumonia. Differential diagnosis is fluid overload/CHF. Electronically Signed   By: Nelson Chimes M.D.   On: 10/11/2019 12:36    EKG: Independently reviewed.  NSR with rate 86; IVCD; no evidence of acute ischemia   Labs on Admission: I have personally reviewed the available labs and imaging studies at the time of the admission.  Pertinent labs:   Glucose 143 BUN 17/Creatinine 1.33/GFR 36 Albumin 3.0 BNP 76.4 LDH 406 HS troponin 14 Ferritin 24 CRP 12.8 Lactate 2.2 WBC 6.1 Hgb 7.5; MCV 69.9; RDW 20.1 D-dimer 1.65 Fibrinogen 482 COVID POSITVE   Assessment/Plan Principal Problem:   Acute hypoxemic  respiratory failure due to COVID-19 [facility]) Active Problems:   Morbid obesity ([place])   Dyslipidemia   Hypertension   Diabetes mellitus without complication ([place])   Coronary artery disease    Acute respiratory failure with hypoxia due to COVID-19 infection -Patient with presenting with SOB without other apparent symptoms -Her son who lives with her is a Actor and apparently also had symptoms; his brother encouraged them both to be tested -She went to her PCP today and was quite hypoxic and so sent to the ER -She does not have a usual home O2 requirement and is currently requiring 2L [place] O2 -COVID POSITIVE -The patient has comorbidities which may increase the risk for ARDS/MODS including: age, HTN, DM, obesity, asthma/COPD -Pertinent labs concerning for COVID include lymphopenia; increased BUN/Creatinine; low procalcitonin; markedly elevated D-dimer (>1); markedly elevated CRP (>7); increased LDH; increased fibrinogen -CXR with multifocal opacities which may be c/w COVID vs. Multifocal PNA -Will not treat with broad-spectrum antibiotics given procalcitonin <0.1 -Will admit for further evaluation, close monitoring, and treatment -Monitor on telemetry x at least 24 hours -At this time, will attempt to avoid use of aerosolized medications and use HFAs instead -Will check daily labs including BMP with Mag, Phos; LFTs; CBC with differential; CRP; ferritin; fibrinogen; D-dimer  -Will order steroids (1 mg/kg divided BID) and Remdesivir (pharmacy consult) given +COVID test, +CXR, and hypoxia <94% on room air -If the patient shows clinical deterioration, consider transfer to ICU with PCCM consultation -If the patient show significant clinical worsening, consider Actemra and/or convalescent plasma - but she does not appear to need these therapies at this time -Will attempt to maintain euvolemia to a net negative fluid status -Will ask the patient to maintain an awake prone position for 16+ hours a day, if  possible, with a minimum of 2-3 hours at a time -With D-dimer <5, will use standard-dosed Lovenox for DVT prevention -Patient was seen wearing full PPE including: gown, gloves, head cover, N95, and  face shield; donning and doffing was in compliance with current standards.  HTN -Continue Avapro but monitor creatinine  DM -Will check A1c -hold Glucophage -Cover with moderate-scale SSI  CAD -Continue ASA, Plavix  Obesity -BMI 47.43 -Weight loss should be encouraged  Asthma/COPD -Continue Advair, Singulair -Will add prn Albuterol HFA  Glaucoma -Continue Alphagan, Trusopt, Xalatan   DVT prophylaxis:  Lovenox  Code Status:  DNR - confirmed with patient Family Communication: None present; I spoke with the patient's son by telephone. Disposition Plan:  Home once clinically improved Consults called: None  Admission status: Admit - It is my clinical opinion that admission to INPATIENT is reasonable and necessary because of the expectation that this patient will require hospital care that crosses at least 2 midnights to treat this condition based on the medical complexity of the problems presented.  Given the aforementioned information, the predictability of an adverse outcome is felt to be significant.     Karmen Bongo MD [facility]   How to contact the [facility] Attending or Consulting provider [place] or covering provider during after hours [place], for this patient?  1. Check the care team in [facility] and look for a) attending/consulting TR[facility] provider listed and b) the [facility] team listed 2. Log into [email or web address] and use [place]'s universal password to access. If you do not have the password, please contact the hospital operator. 3. Locate the [facility] provider you are looking for under [facility] and page to a number that you can be directly reached. 4. If you still have difficulty reaching the provider, please page the [facility] (Director on Call) for the Hospitalists listed on  amion for assistance.   10/11/2019, 6:14 PM

## 2019-10-12 DIAGNOSIS — D500 Iron deficiency anemia secondary to blood loss (chronic): Secondary | ICD-10-CM

## 2019-10-12 LAB — CBC WITH DIFFERENTIAL/PLATELET
Abs Immature Granulocytes: 0.02 10*3/uL (ref 0.00–0.07)
Basophils Absolute: 0 10*3/uL (ref 0.0–0.1)
Basophils Relative: 0 %
Eosinophils Absolute: 0 10*3/uL (ref 0.0–0.5)
Eosinophils Relative: 0 %
HCT: 24.9 % — ABNORMAL LOW (ref 36.0–46.0)
Hemoglobin: 7.3 g/dL — ABNORMAL LOW (ref 12.0–15.0)
Immature Granulocytes: 0 %
Lymphocytes Relative: 7 %
Lymphs Abs: 0.4 10*3/uL — ABNORMAL LOW (ref 0.7–4.0)
MCH: 20.3 pg — ABNORMAL LOW (ref 26.0–34.0)
MCHC: 29.3 g/dL — ABNORMAL LOW (ref 30.0–36.0)
MCV: 69.4 fL — ABNORMAL LOW (ref 80.0–100.0)
Monocytes Absolute: 0.2 10*3/uL (ref 0.1–1.0)
Monocytes Relative: 4 %
Neutro Abs: 5 10*3/uL (ref 1.7–7.7)
Neutrophils Relative %: 89 %
Platelets: 221 10*3/uL (ref 150–400)
RBC: 3.59 MIL/uL — ABNORMAL LOW (ref 3.87–5.11)
RDW: 19.9 % — ABNORMAL HIGH (ref 11.5–15.5)
WBC: 5.7 10*3/uL (ref 4.0–10.5)
nRBC: 0.7 % — ABNORMAL HIGH (ref 0.0–0.2)

## 2019-10-12 LAB — IRON AND TIBC
Iron: 12 ug/dL — ABNORMAL LOW (ref 28–170)
Saturation Ratios: 4 % — ABNORMAL LOW (ref 10.4–31.8)
TIBC: 305 ug/dL (ref 250–450)
UIBC: 293 ug/dL

## 2019-10-12 LAB — COMPREHENSIVE METABOLIC PANEL
ALT: 14 U/L (ref 0–44)
AST: 28 U/L (ref 15–41)
Albumin: 2.6 g/dL — ABNORMAL LOW (ref 3.5–5.0)
Alkaline Phosphatase: 79 U/L (ref 38–126)
Anion gap: 10 (ref 5–15)
BUN: 16 mg/dL (ref 8–23)
CO2: 25 mmol/L (ref 22–32)
Calcium: 7.8 mg/dL — ABNORMAL LOW (ref 8.9–10.3)
Chloride: 105 mmol/L (ref 98–111)
Creatinine, Ser: 1.16 mg/dL — ABNORMAL HIGH (ref 0.44–1.00)
GFR calc Af Amer: 49 mL/min — ABNORMAL LOW (ref >60)
GFR calc non Af Amer: 43 mL/min — ABNORMAL LOW (ref >60)
Glucose, Bld: 200 mg/dL — ABNORMAL HIGH (ref 70–99)
Potassium: 3.7 mmol/L (ref 3.5–5.1)
Sodium: 140 mmol/L (ref 135–145)
Total Bilirubin: 0.8 mg/dL (ref 0.3–1.2)
Total Protein: 5.9 g/dL — ABNORMAL LOW (ref 6.5–8.1)

## 2019-10-12 LAB — MAGNESIUM: Magnesium: 2.3 mg/dL (ref 1.7–2.4)

## 2019-10-12 LAB — PHOSPHORUS: Phosphorus: 3.4 mg/dL (ref 2.5–4.6)

## 2019-10-12 LAB — GLUCOSE, CAPILLARY
Glucose-Capillary: 155 mg/dL — ABNORMAL HIGH (ref 70–99)
Glucose-Capillary: 158 mg/dL — ABNORMAL HIGH (ref 70–99)
Glucose-Capillary: 187 mg/dL — ABNORMAL HIGH (ref 70–99)
Glucose-Capillary: 188 mg/dL — ABNORMAL HIGH (ref 70–99)

## 2019-10-12 LAB — D-DIMER, QUANTITATIVE: D-Dimer, Quant: 1.51 ug/mL-FEU — ABNORMAL HIGH (ref 0.00–0.50)

## 2019-10-12 LAB — VITAMIN B12: Vitamin B-12: 371 pg/mL (ref 180–914)

## 2019-10-12 LAB — C-REACTIVE PROTEIN: CRP: 15.1 mg/dL — ABNORMAL HIGH (ref <1.0)

## 2019-10-12 LAB — FERRITIN: Ferritin: 20 ng/mL (ref 11–307)

## 2019-10-12 MED ORDER — POLYETHYLENE GLYCOL 3350 17 G PO PACK: 17.0000 g | PACK | Freq: Every day | ORAL | Status: DC | PRN

## 2019-10-12 MED ORDER — METHYLPREDNISOLONE SODIUM SUCC 40 MG IJ SOLR
40.0000 mg | Freq: Three times a day (TID) | INTRAMUSCULAR | Status: DC
  Administered 2019-10-12 – 2019-10-24 (×38): 40 mg via INTRAVENOUS
  Filled 2019-10-12 (×38): qty 1

## 2019-10-12 MED ORDER — ASCORBIC ACID 500 MG PO TABS
500.0000 mg | ORAL_TABLET | Freq: Every day | ORAL | Status: DC
  Administered 2019-10-12 – 2019-10-24 (×13): 500 mg via ORAL
  Filled 2019-10-12 (×13): qty 1

## 2019-10-12 MED ORDER — FERROUS SULFATE 325 (65 FE) MG PO TABS
325.0000 mg | ORAL_TABLET | Freq: Three times a day (TID) | ORAL | Status: DC
  Administered 2019-10-12 – 2019-10-24 (×38): 325 mg via ORAL
  Filled 2019-10-12 (×40): qty 1

## 2019-10-12 MED ORDER — TOCILIZUMAB 400 MG/20ML IV SOLN
800.0000 mg | Freq: Once | INTRAVENOUS | Status: AC
  Administered 2019-10-12: 800 mg via INTRAVENOUS
  Filled 2019-10-12: qty 40

## 2019-10-12 MED ORDER — IPRATROPIUM-ALBUTEROL 20-100 MCG/ACT IN AERS
1.0000 | INHALATION_SPRAY | Freq: Four times a day (QID) | RESPIRATORY_TRACT | Status: DC
  Administered 2019-10-12 – 2019-10-13 (×5): 1 via RESPIRATORY_TRACT
  Filled 2019-10-12: qty 4

## 2019-10-12 MED ORDER — ENOXAPARIN SODIUM 80 MG/0.8ML ~~LOC~~ SOLN
65.0000 mg | SUBCUTANEOUS | Status: DC
  Administered 2019-10-12 – 2019-10-23 (×12): 65 mg via SUBCUTANEOUS
  Filled 2019-10-12 (×13): qty 0.8

## 2019-10-12 MED ORDER — SENNOSIDES-DOCUSATE SODIUM 8.6-50 MG PO TABS: 2.0000 | ORAL_TABLET | Freq: Every evening | ORAL | Status: DC | PRN

## 2019-10-12 MED ORDER — WHITE PETROLATUM EX OINT
TOPICAL_OINTMENT | CUTANEOUS | Status: AC
  Administered 2019-10-12: 1
  Filled 2019-10-12: qty 28.35

## 2019-10-12 MED ORDER — ZINC SULFATE 220 (50 ZN) MG PO CAPS
220.0000 mg | ORAL_CAPSULE | Freq: Every day | ORAL | Status: DC
  Administered 2019-10-12 – 2019-10-24 (×13): 220 mg via ORAL
  Filled 2019-10-12 (×13): qty 1

## 2019-10-12 NOTE — Progress Notes (Addendum)
PROGRESS NOTE    Debra Acosta  [id number] DOB: August 31, 1931 DOA: 10/11/2019 PCP: Lucianne Lei, MD   Brief Narrative:  86 year old with history of HTN, HLD, DM2, morbid obesity, CAD status post stent presented with shortness of breath.  In the ER patient was noted to be hypoxic down to 60%, positive for COVID-19 pneumonia. Found to have iron def aneami therefore started on Iron supplements.    Assessment & Plan:   Principal Problem:   Acute hypoxemic respiratory failure due to COVID-19 [facility]) Active Problems:   Morbid obesity ([place])   DM2 (diabetes mellitus, type 2) ([place])   Dyslipidemia   Hypertension   Diabetes mellitus without complication ([place])   Coronary artery disease   Iron deficiency anemia due to chronic blood loss   Acute hypoxic respiratory failure down to 60% on room air secondary to COVID-19 pneumonia -Oxygen levels-4 L nasal cannula.  Not on any home oxygen. -Remdesivir-day 2. Order Actemra. Discussed with patient and son about off Label use.  -Solumedrol-D2 -Routine: Labs have been reviewed including ferritin, LDH, CRP, d-dimer, fibrinogen.  Will need to trend this lab daily. -Vitamin C & Zinc. Prone >16hrs/day.  -procalcitonin-Neg -Chest x-ray-multifocal opacities-infiltrates -Supportive care-antitussive, inhalers, I-S/flutter -CODE STATUS confirmed  Had extensive discussion with the patient regarding off label use of Actemragiven worsening of shortness of breath, oxygen requirement from hypoxia and elevation in inflammatory markers.  Denies any active/latent tuberculosis, hepatitis infection, active immunosuppressive state including chemotherapy.  No severe sepsis.  Patient and family understood and all the questions answered.  Agreed to proceed with it.  Essential hypertension -On Avapro  Diabetes mellitus type 2 -Insulin sliding scale and Accu-Chek.  Glucophage on hold  Coronary artery disease -Currently without chest pain -Continue aspirin and  Plavix  Microcytic anemia -We will check iron studies, B12 and folate. -Hemoglobin around 7.3.  May require 1 unit PRBC transfusion if continues to drift further down. Iron Supp with Bowel Regimen ordered.   History of COPD and asthma -Continue home bronchodilators.  Morbid obesity with BMI greater than 45 -Weight loss.  History of glaucoma -Continue home meds   DVT prophylaxis: Lovenox Code Status: DNR Family Communication:  Spoke with her son.  Disposition Plan: Maintain hospital stay due to significant amount of hypoxia, not on any home oxygen.  Unsafe for discharge.   Subjective: Patient was feeling slightly dyspneic and cold this morning but saturating about 85% with 6 L nasal cannula therefore transition to high flow.  She follows up with more comfortable.  Denied any evidence of bleeding.  Spoke with the patient's son who tells me patient lives with his brother.  They have not noticed any signs of bleeding nor any other issues at home.  He is very appreciative of the care patient is receiving in the hospital.  Review of Systems Otherwise negative except as per HPI, including: General: Denies fever, night sweats or unintended weight loss. Resp: Denies cough, wheezing,  Cardiac: Denies chest pain, palpitations, orthopnea, paroxysmal nocturnal dyspnea. GI: Denies abdominal pain, nausea, vomiting, diarrhea or constipation GU: Denies dysuria, frequency, hesitancy or incontinence MS: Denies muscle aches, joint pain or swelling Neuro: Denies headache, neurologic deficits (focal weakness, numbness, tingling), abnormal gait Psych: Denies anxiety, depression, SI/HI/AVH Skin: Denies new rashes or lesions ID: Denies sick contacts, exotic exposures, travel  Objective: Vitals:   10/12/19 0000 10/12/19 0445 10/12/19 0800 10/12/19 0911  BP:  115/77 101/84   Pulse:  65 76 74  Resp: (!) 21 (!) 22 20 17  Temp:  98.6 F (37 C)    TempSrc:  Oral    SpO2: 95% 92% (!) 83% 92%   Weight:      Height:        Intake/Output Summary (Last 24 hours) at 10/12/2019 1121 Last data filed at 10/12/2019 1059 Gross per 24 hour  Intake 500 ml  Output 300 ml  Net 200 ml   Filed Weights   10/11/19 1135  Weight: 129.3 kg    Examination:  General exam: High flow nasal cannula, chronically ill-appearing Respiratory system: Diffuse rhonchi Cardiovascular system: S1 & S2 heard, RRR. No JVD, murmurs, rubs, gallops or clicks. No pedal edema. Gastrointestinal system: Abdomen is nondistended, soft and nontender. No organomegaly or masses felt. Normal bowel sounds heard. Central nervous system: Alert and oriented. No focal neurological deficits. Extremities: Symmetric 5 x 5 power. Skin: No rashes, lesions or ulcers Psychiatry: Judgement and insight appear normal. Mood & affect appropriate.     Data Reviewed:   CBC: Recent Labs  Lab 10/11/19 1200 10/12/19 0310  WBC 6.1 5.7  NEUTROABS 5.3 5.0  HGB 7.5* 7.3*  HCT 26.0* 24.9*  MCV 69.9* 69.4*  PLT 224 [id number]   Basic Metabolic Panel: Recent Labs  Lab 10/11/19 1200 10/12/19 0310  NA 137 140  K 3.5 3.7  CL 105 105  CO2 24 25  GLUCOSE 143* 200*  BUN 17 16  CREATININE 1.33* 1.16*  CALCIUM 7.7* 7.8*  MG  --  2.3  PHOS  --  3.4   GFR: Estimated Creatinine Clearance: 47.2 mL/min (A) (by C-G formula based on SCr of 1.16 mg/dL (H)). Liver Function Tests: Recent Labs  Lab 10/11/19 1200 10/12/19 0310  AST 31 28  ALT 14 14  ALKPHOS 82 79  BILITOT 0.6 0.8  PROT 6.6 5.9*  ALBUMIN 3.0* 2.6*   No results for input(s): LIPASE, AMYLASE in the last 168 hours. No results for input(s): AMMONIA in the last 168 hours. Coagulation Profile: No results for input(s): INR, PROTIME in the last 168 hours. Cardiac Enzymes: No results for input(s): CKTOTAL, CKMB, CKMBINDEX, TROPONINI in the last 168 hours. BNP (last 3 results) No results for input(s): PROBNP in the last 8760 hours. HbA1C: Recent Labs    10/11/19 1200   HGBA1C 6.6*   CBG: Recent Labs  Lab 10/11/19 1652 10/11/19 2106 10/12/19 0752  GLUCAP 119* 189* 155*   Lipid Profile: Recent Labs    10/11/19 1200  TRIG 78   Thyroid Function Tests: No results for input(s): TSH, T4TOTAL, FREET4, T3FREE, THYROIDAB in the last 72 hours. Anemia Panel: Recent Labs    10/11/19 1200 10/12/19 0310 10/12/19 0820  VITAMINB12  --   --  371  FERRITIN 24 20  --   TIBC  --   --  305  IRON  --   --  12*   Sepsis Labs: Recent Labs  Lab 10/11/19 1200 10/11/19 1353  PROCALCITON 0.26  --   LATICACIDVEN 2.2* 2.0*    Recent Results (from the past 240 hour(s))  Respiratory Panel by RT PCR (Flu A&B, Covid) - Nasopharyngeal Swab     Status: Abnormal   Collection Time: 10/11/19 11:54 AM   Specimen: Nasopharyngeal Swab  Result Value Ref Range Status   SARS Coronavirus 2 by RT PCR POSITIVE (A) NEGATIVE Final    Comment: RESULT CALLED TO, READ BACK BY AND VERIFIED WITH: Claretta Fraise RN 13:35 10/11/19 ([email or web address]) (NOTE) SARS-CoV-2 target nucleic acids are DETECTED. SARS-CoV-2 RNA is generally detectable  in upper respiratory specimens  during the acute phase of infection. Positive results are indicative of the presence of the identified virus, but do not rule out bacterial infection or co-infection with other pathogens not detected by the test. Clinical correlation with patient history and other diagnostic information is necessary to determine patient infection status. The expected result is Negative. Fact Sheet for Patients:  PinkCheek.be Fact Sheet for Healthcare Providers: GravelBags.it This test is not yet approved or cleared by the [place] FDA and  has been authorized for detection and/or diagnosis of SARS-CoV-2 by FDA under an Emergency Use Authorization (EUA).  This EUA will remain in effect (meaning this test can be used)  for the duration of  the COVID-19 declaration under Section  564(b)(1) of the Act, 21 U.S.C. section 360bbb-3(b)(1), unless the authorization is terminated or revoked sooner.    Influenza A by PCR NEGATIVE NEGATIVE Final   Influenza B by PCR NEGATIVE NEGATIVE Final    Comment: (NOTE) The Xpert Xpress SARS-CoV-2/FLU/RSV assay is intended as an aid in  the diagnosis of influenza from Nasopharyngeal swab specimens and  should not be used as a sole basis for treatment. Nasal washings and  aspirates are unacceptable for Xpert Xpress SARS-CoV-2/FLU/RSV  testing. Fact Sheet for Patients: PinkCheek.be Fact Sheet for Healthcare Providers: GravelBags.it This test is not yet approved or cleared by the [place] FDA and  has been authorized for detection and/or diagnosis of SARS-CoV-2 by  FDA under an Emergency Use Authorization (EUA). This EUA will remain  in effect (meaning this test can be used) for the duration of the  Covid-19 declaration under Section 564(b)(1) of the Act, 21  U.S.C. section 360bbb-3(b)(1), unless the authorization is  terminated or revoked. Performed at [facility], [place] [address] [place], [place] [address]          Radiology Studies: DG Chest Port 1 View  Result Date: 10/11/2019 CLINICAL DATA:  Shortness of breath EXAM: PORTABLE CHEST 1 VIEW COMPARISON:  08/11/2005 FINDINGS: Mild cardiomegaly. Aortic atherosclerosis and tortuosity. Widespread hazy pulmonary infiltrates worrisome for viral pneumonia. The differential diagnosis is fluid overload/early congestive heart failure. No visible effusion. No acute bone finding. IMPRESSION: Widespread hazy pulmonary infiltrates worrisome for viral pneumonia. Differential diagnosis is fluid overload/CHF. Electronically Signed   By: Nelson Chimes M.D.   On: 10/11/2019 12:36        Scheduled Meds: . vitamin C  500 mg Oral Daily  . aspirin EC  81 mg Oral Daily  . brimonidine  1 drop Both Eyes TID  . clopidogrel  75  mg Oral Daily  . dorzolamide  1 drop Both Eyes TID  . enoxaparin (LOVENOX) injection  40 mg Subcutaneous Q24H  . ferrous sulfate  325 mg Oral TID WC  . insulin aspart  0-15 Units Subcutaneous TID WC  . Ipratropium-Albuterol  1 puff Inhalation Q6H  . irbesartan  150 mg Oral Daily  . latanoprost  1 drop Both Eyes QHS  . methylPREDNISolone (SOLU-MEDROL) injection  40 mg Intravenous Q8H  . mometasone-formoterol  2 puff Inhalation BID  . montelukast  10 mg Oral QHS  . sodium chloride flush  3 mL Intravenous Q12H  . zinc sulfate  220 mg Oral Daily   Continuous Infusions: . sodium chloride    . remdesivir 100 mg in NS 100 mL 100 mg (10/12/19 1059)     LOS: 1 day   Time spent= 40 mins    Fritz Cauthon Arsenio Loader, MD [facility]  If 7PM-7AM, please contact night-coverage  10/12/2019, 11:21 AM

## 2019-10-12 NOTE — Progress Notes (Signed)
Writer offered to call patient's family to update on her condition. Patient alert and oriented x 4 verbalized that she already talked with her children and they are OK. Patient said that I don't need to call them back.

## 2019-10-12 NOTE — Progress Notes (Signed)
Patient able to walk in her room few steps with 1 assist; unsteady on her feet.. Sat O2 91% on 10 L HFNC. Will continue to monitor.

## 2019-10-12 NOTE — Progress Notes (Signed)
[place] was changed with HFNC 10 ML per MD verbal. Sat O2 93%. Will continue to monitor.

## 2019-10-12 NOTE — Progress Notes (Signed)
Patient sitting at the edge of the bed. On 6L oxigen [place] - her sat O2 is 88%-89%. SOB with exertion.  MD notified. Will continue to monitor.

## 2019-10-13 LAB — FOLATE RBC
Folate, Hemolysate: 230 ng/mL
Folate, RBC: 852 ng/mL (ref >498)
Hematocrit: 27 % — ABNORMAL LOW (ref 34.0–46.6)

## 2019-10-13 LAB — D-DIMER, QUANTITATIVE: D-Dimer, Quant: 2.08 ug/mL-FEU — ABNORMAL HIGH (ref 0.00–0.50)

## 2019-10-13 LAB — COMPREHENSIVE METABOLIC PANEL
ALT: 18 U/L (ref 0–44)
AST: 31 U/L (ref 15–41)
Albumin: 2.7 g/dL — ABNORMAL LOW (ref 3.5–5.0)
Alkaline Phosphatase: 88 U/L (ref 38–126)
Anion gap: 10 (ref 5–15)
BUN: 22 mg/dL (ref 8–23)
CO2: 25 mmol/L (ref 22–32)
Calcium: 8.4 mg/dL — ABNORMAL LOW (ref 8.9–10.3)
Chloride: 106 mmol/L (ref 98–111)
Creatinine, Ser: 1.09 mg/dL — ABNORMAL HIGH (ref 0.44–1.00)
GFR calc Af Amer: 53 mL/min — ABNORMAL LOW (ref >60)
GFR calc non Af Amer: 46 mL/min — ABNORMAL LOW (ref >60)
Glucose, Bld: 198 mg/dL — ABNORMAL HIGH (ref 70–99)
Potassium: 4.1 mmol/L (ref 3.5–5.1)
Sodium: 141 mmol/L (ref 135–145)
Total Bilirubin: 0.5 mg/dL (ref 0.3–1.2)
Total Protein: 6.2 g/dL — ABNORMAL LOW (ref 6.5–8.1)

## 2019-10-13 LAB — PHOSPHORUS: Phosphorus: 3.2 mg/dL (ref 2.5–4.6)

## 2019-10-13 LAB — CBC WITH DIFFERENTIAL/PLATELET
Abs Immature Granulocytes: 0.06 10*3/uL (ref 0.00–0.07)
Basophils Absolute: 0 10*3/uL (ref 0.0–0.1)
Basophils Relative: 0 %
Eosinophils Absolute: 0 10*3/uL (ref 0.0–0.5)
Eosinophils Relative: 0 %
HCT: 26.2 % — ABNORMAL LOW (ref 36.0–46.0)
Hemoglobin: 7.6 g/dL — ABNORMAL LOW (ref 12.0–15.0)
Immature Granulocytes: 1 %
Lymphocytes Relative: 9 %
Lymphs Abs: 0.6 10*3/uL — ABNORMAL LOW (ref 0.7–4.0)
MCH: 20 pg — ABNORMAL LOW (ref 26.0–34.0)
MCHC: 29 g/dL — ABNORMAL LOW (ref 30.0–36.0)
MCV: 68.9 fL — ABNORMAL LOW (ref 80.0–100.0)
Monocytes Absolute: 0.3 10*3/uL (ref 0.1–1.0)
Monocytes Relative: 5 %
Neutro Abs: 5.4 10*3/uL (ref 1.7–7.7)
Neutrophils Relative %: 85 %
Platelets: 259 10*3/uL (ref 150–400)
RBC: 3.8 MIL/uL — ABNORMAL LOW (ref 3.87–5.11)
RDW: 20.1 % — ABNORMAL HIGH (ref 11.5–15.5)
WBC: 6.3 10*3/uL (ref 4.0–10.5)
nRBC: 2.2 % — ABNORMAL HIGH (ref 0.0–0.2)

## 2019-10-13 LAB — FERRITIN: Ferritin: 28 ng/mL (ref 11–307)

## 2019-10-13 LAB — GLUCOSE, CAPILLARY
Glucose-Capillary: 201 mg/dL — ABNORMAL HIGH (ref 70–99)
Glucose-Capillary: 169 mg/dL — ABNORMAL HIGH (ref 70–99)
Glucose-Capillary: 170 mg/dL — ABNORMAL HIGH (ref 70–99)
Glucose-Capillary: 155 mg/dL — ABNORMAL HIGH (ref 70–99)

## 2019-10-13 LAB — MAGNESIUM: Magnesium: 2.6 mg/dL — ABNORMAL HIGH (ref 1.7–2.4)

## 2019-10-13 LAB — C-REACTIVE PROTEIN: CRP: 11.5 mg/dL — ABNORMAL HIGH (ref <1.0)

## 2019-10-13 MED ORDER — VITAMIN D 25 MCG (1000 UNIT) PO TABS
1000.0000 [IU] | ORAL_TABLET | Freq: Every day | ORAL | Status: DC
  Administered 2019-10-13 – 2019-10-24 (×12): 1000 [IU] via ORAL
  Filled 2019-10-13 (×12): qty 1

## 2019-10-13 MED ORDER — HYDROCOD POLST-CPM POLST ER 10-8 MG/5ML PO SUER
5.0000 mL | Freq: Two times a day (BID) | ORAL | Status: AC
  Administered 2019-10-13 – 2019-10-15 (×6): 5 mL via ORAL
  Filled 2019-10-13 (×6): qty 5

## 2019-10-13 MED ORDER — ALBUTEROL SULFATE HFA 108 (90 BASE) MCG/ACT IN AERS
2.0000 | INHALATION_SPRAY | Freq: Four times a day (QID) | RESPIRATORY_TRACT | Status: DC
  Administered 2019-10-13 – 2019-10-22 (×35): 2 via RESPIRATORY_TRACT
  Filled 2019-10-13: qty 6.7

## 2019-10-13 MED ORDER — FUROSEMIDE 10 MG/ML IJ SOLN
20.0000 mg | Freq: Two times a day (BID) | INTRAMUSCULAR | Status: DC
  Administered 2019-10-13 – 2019-10-14 (×3): 20 mg via INTRAVENOUS
  Filled 2019-10-13 (×3): qty 2

## 2019-10-13 MED ORDER — IPRATROPIUM BROMIDE HFA 17 MCG/ACT IN AERS
2.0000 | INHALATION_SPRAY | Freq: Four times a day (QID) | RESPIRATORY_TRACT | Status: DC
  Administered 2019-10-13 – 2019-10-22 (×35): 2 via RESPIRATORY_TRACT
  Filled 2019-10-13: qty 12.9

## 2019-10-13 NOTE — Progress Notes (Signed)
PROGRESS NOTE  Debra Acosta [id number] DOB: 1931/03/31 DOA: 10/11/2019 PCP: Lucianne Lei, MD  HPI/Recap of past 62 hours: 86 year old with history of HTN, HLD, DM2, morbid obesity, CAD status post stent presented with shortness of breath.  In the ER patient was noted to be hypoxic down to 60%, positive for COVID-19 pneumonia. Found to have iron def aneami therefore started on Iron supplements.   10/13/19: Seen and examined.  Easily gets short of breath and minimal exertion.  Still requiring high flow nasal cannula.  Currently on 10 L with saturation in the low 90s.  Added IV Lasix 20 mg twice daily.  Continue COVID-19 directed therapies with pronation.  Updated her daughter Debra Acosta on the phone.  Assessment/Plan: Principal Problem:   Acute hypoxemic respiratory failure due to COVID-19 [facility]) Active Problems:   Morbid obesity ([place])   DM2 (diabetes mellitus, type 2) ([place])   Dyslipidemia   Hypertension   Diabetes mellitus without complication ([place])   Coronary artery disease   Iron deficiency anemia due to chronic blood loss  Acute hypoxic respiratory failure down to 60% on room air secondary to COVID-19 viral pneumonia.   -Oxygen levels-high flow nasal cannula on 10 L with O2 saturation in the low 90s.  Not on oxygen supplementation at baseline. -Remdesivir-day 3/5.  Received Ampyra on 10/12/2019.   -Continue IV Solu-Medrol, day number 2 out of 10.   -Continue incentive spirometer and flutter valve. Continue Tussionex twice daily x3 days. -Added IV Lasix 20 mg twice daily -Added pronation -Vitamin C & Zinc, add vitamin D3. -Continue to trend inflammatory markers -Negative procalcitonin. -Independently reviewed chest x-ray which showed multiple focal infiltrates bilaterally. -CODE STATUS confirmed DNR.  Essential hypertension -On Avapro, continue  Diabetes mellitus type 2 with hyperglycemia -Insulin sliding scale and Accu-Chek.  Glucophage on hold Hemoglobin A1c 6.6  on 10/11/2019. Hypoglycemia likely exacerbated by IV steroids.  Coronary artery disease -Currently without chest pain -Continue aspirin and Plavix  Iron deficiency anemia  Iron studies suggestive of profound iron deficiency Continue oral iron supplement 3 times daily with bowel regimen. No overt signs of bleeding Hemoglobin 7.6 from 7.3 with MCV of 68.  History of COPD and asthma -Continue home bronchodilators. -Maintain O2 saturation greater than 92%.  Morbid obesity with BMI greater than 45 -Weight loss outpatient once stable.  History of glaucoma -Continue home meds   DVT prophylaxis: Lovenox subcu daily Code Status: DNR Family Communication:   Updated her daughter Debra Acosta via phone on 10/13/2019.  All questions answered to her satisfaction.   Disposition Plan:  Patient is from home.  Anticipate discharge to home once oxygen requirement is close to baseline.  Barrier to discharge continues to require high level of oxygen, currently on high flow nasal cannula 10 L.    Objective: Vitals:   10/13/19 0556 10/13/19 0806 10/13/19 0843 10/13/19 0845  BP: (!) 131/55 (!) 145/91    Pulse: 68 74 76 72  Resp: (!) 24 (!) 27 (!) 21 20  Temp: 97.7 F (36.5 C) 97.9 F (36.6 C)    TempSrc: Oral Oral    SpO2: 94% 92% 92% 91%  Weight:      Height:        Intake/Output Summary (Last 24 hours) at 10/13/2019 0946 Last data filed at 10/13/2019 0939 Gross per 24 hour  Intake 630 ml  Output 900 ml  Net -270 ml   Filed Weights   10/11/19 1135  Weight: 129.3 kg    Exam:  .  General: 86 y.o. year-old female well developed well nourished in no acute distress.  Alert and oriented x3. . Cardiovascular: Regular rate and rhythm with no rubs or gallops.  No thyromegaly or JVD noted.   Marland Kitchen Respiratory: Diffuse rales and wheezing bilaterally.  Poor inspiratory effort.  On high flow nasal cannula. . Abdomen: Soft nontender nondistended with normal bowel sounds x4  quadrants. . Musculoskeletal: Trace lower extremity edema bilaterally. Marland Kitchen Psychiatry: Mood is appropriate for condition and setting   Data Reviewed: CBC: Recent Labs  Lab 10/11/19 1200 10/12/19 0310 10/13/19 0354  WBC 6.1 5.7 6.3  NEUTROABS 5.3 5.0 5.4  HGB 7.5* 7.3* 7.6*  HCT 26.0* 24.9* 26.2*  MCV 69.9* 69.4* 68.9*  PLT 224 221 [id number]   Basic Metabolic Panel: Recent Labs  Lab 10/11/19 1200 10/12/19 0310 10/13/19 0354  NA 137 140 141  K 3.5 3.7 4.1  CL 105 105 106  CO2 24 25 25   GLUCOSE 143* 200* 198*  BUN 17 16 22   CREATININE 1.33* 1.16* 1.09*  CALCIUM 7.7* 7.8* 8.4*  MG  --  2.3 2.6*  PHOS  --  3.4 3.2   GFR: Estimated Creatinine Clearance: 50.2 mL/min (A) (by C-G formula based on SCr of 1.09 mg/dL (H)). Liver Function Tests: Recent Labs  Lab 10/11/19 1200 10/12/19 0310 10/13/19 0354  AST 31 28 31   ALT 14 14 18   ALKPHOS 82 79 88  BILITOT 0.6 0.8 0.5  PROT 6.6 5.9* 6.2*  ALBUMIN 3.0* 2.6* 2.7*   No results for input(s): LIPASE, AMYLASE in the last 168 hours. No results for input(s): AMMONIA in the last 168 hours. Coagulation Profile: No results for input(s): INR, PROTIME in the last 168 hours. Cardiac Enzymes: No results for input(s): CKTOTAL, CKMB, CKMBINDEX, TROPONINI in the last 168 hours. BNP (last 3 results) No results for input(s): PROBNP in the last 8760 hours. HbA1C: Recent Labs    10/11/19 1200  HGBA1C 6.6*   CBG: Recent Labs  Lab 10/12/19 0752 10/12/19 1200 10/12/19 1640 10/12/19 2121 10/13/19 0748  GLUCAP 155* 158* 187* 188* 201*   Lipid Profile: Recent Labs    10/11/19 1200  TRIG 78   Thyroid Function Tests: No results for input(s): TSH, T4TOTAL, FREET4, T3FREE, THYROIDAB in the last 72 hours. Anemia Panel: Recent Labs    10/12/19 0310 10/12/19 0820 10/13/19 0354  VITAMINB12  --  371  --   FERRITIN 20  --  28  TIBC  --  305  --   IRON  --  12*  --    Urine analysis: No results found for: COLORURINE, APPEARANCEUR,  LABSPEC, PHURINE, GLUCOSEU, HGBUR, BILIRUBINUR, KETONESUR, PROTEINUR, UROBILINOGEN, NITRITE, LEUKOCYTESUR Sepsis Labs: @LABRCNTIP (procalcitonin:4,lacticidven:4)  ) Recent Results (from the past 240 hour(s))  Blood Culture (routine x 2)     Status: None (Preliminary result)   Collection Time: 10/11/19 11:53 AM   Specimen: BLOOD  Result Value Ref Range Status   Specimen Description BLOOD RIGHT ANTECUBITAL  Final   Special Requests   Final    BOTTLES DRAWN AEROBIC AND ANAEROBIC Blood Culture results may not be optimal due to an excessive volume of blood received in culture bottles   Culture   Final    NO GROWTH < 24 HOURS Performed at [facility], [place] [address] [place], [place] [address]    Report Status PENDING  Incomplete  Respiratory Panel by RT PCR (Flu A&B, Covid) - Nasopharyngeal Swab     Status: Abnormal   Collection  Time: 10/11/19 11:54 AM   Specimen: Nasopharyngeal Swab  Result Value Ref Range Status   SARS Coronavirus 2 by RT PCR POSITIVE (A) NEGATIVE Final    Comment: RESULT CALLED TO, READ BACK BY AND VERIFIED WITH: Claretta Fraise RN 13:35 10/11/19 ([email or web address]) (NOTE) SARS-CoV-2 target nucleic acids are DETECTED. SARS-CoV-2 RNA is generally detectable in upper respiratory specimens  during the acute phase of infection. Positive results are indicative of the presence of the identified virus, but do not rule out bacterial infection or co-infection with other pathogens not detected by the test. Clinical correlation with patient history and other diagnostic information is necessary to determine patient infection status. The expected result is Negative. Fact Sheet for Patients:  PinkCheek.be Fact Sheet for Healthcare Providers: GravelBags.it This test is not yet approved or cleared by the [place] FDA and  has been authorized for detection and/or diagnosis of SARS-CoV-2 by FDA under an Emergency Use  Authorization (EUA).  This EUA will remain in effect (meaning this test can be used)  for the duration of  the COVID-19 declaration under Section 564(b)(1) of the Act, 21 U.S.C. section 360bbb-3(b)(1), unless the authorization is terminated or revoked sooner.    Influenza A by PCR NEGATIVE NEGATIVE Final   Influenza B by PCR NEGATIVE NEGATIVE Final    Comment: (NOTE) The Xpert Xpress SARS-CoV-2/FLU/RSV assay is intended as an aid in  the diagnosis of influenza from Nasopharyngeal swab specimens and  should not be used as a sole basis for treatment. Nasal washings and  aspirates are unacceptable for Xpert Xpress SARS-CoV-2/FLU/RSV  testing. Fact Sheet for Patients: PinkCheek.be Fact Sheet for Healthcare Providers: GravelBags.it This test is not yet approved or cleared by the [place] FDA and  has been authorized for detection and/or diagnosis of SARS-CoV-2 by  FDA under an Emergency Use Authorization (EUA). This EUA will remain  in effect (meaning this test can be used) for the duration of the  Covid-19 declaration under Section 564(b)(1) of the Act, 21  U.S.C. section 360bbb-3(b)(1), unless the authorization is  terminated or revoked. Performed at [facility], [place] [address] [place], [place] [address]   Blood Culture (routine x 2)     Status: None (Preliminary result)   Collection Time: 10/11/19 11:58 AM   Specimen: BLOOD LEFT HAND  Result Value Ref Range Status   Specimen Description BLOOD LEFT HAND  Final   Special Requests   Final    BOTTLES DRAWN AEROBIC ONLY Blood Culture results may not be optimal due to an inadequate volume of blood received in culture bottles   Culture   Final    NO GROWTH < 24 HOURS Performed at [facility], [place] [address], [place], [place] [address]    Report Status PENDING  Incomplete      Studies: No results found.  Scheduled Meds: . albuterol  2 puff Inhalation Q6H   . vitamin C  500 mg Oral Daily  . aspirin EC  81 mg Oral Daily  . brimonidine  1 drop Both Eyes TID  . chlorpheniramine-HYDROcodone  5 mL Oral Q12H  . cholecalciferol  1,000 Units Oral Daily  . clopidogrel  75 mg Oral Daily  . dorzolamide  1 drop Both Eyes TID  . enoxaparin (LOVENOX) injection  65 mg Subcutaneous Q24H  . ferrous sulfate  325 mg Oral TID WC  . furosemide  20 mg Intravenous BID  . insulin aspart  0-15 Units Subcutaneous TID WC  . ipratropium  2 puff Inhalation Q6H  . irbesartan  150 mg Oral Daily  . latanoprost  1 drop Both Eyes QHS  . methylPREDNISolone (SOLU-MEDROL) injection  40 mg Intravenous Q8H  . mometasone-formoterol  2 puff Inhalation BID  . montelukast  10 mg Oral QHS  . sodium chloride flush  3 mL Intravenous Q12H  . zinc sulfate  220 mg Oral Daily    Continuous Infusions: . sodium chloride    . remdesivir 100 mg in NS 100 mL 100 mg (10/13/19 0852)     LOS: 2 days     Kayleen Memos, MD [facility] Pager [phone]  If 7PM-7AM, please contact night-coverage [email or web address] Password [facility] 10/13/2019, 9:46 AM

## 2019-10-13 NOTE — Progress Notes (Addendum)
At this time patient is receiving oxygen via HFNC 15 L due to moving and sat O2 is in low 90s. Dyspnea with minimal exertion, SOB. MD aware. Patient's daughter was informed about her mother's condition. Will continue to monitor.

## 2019-10-13 NOTE — Progress Notes (Addendum)
Patient able to get out of bed to use the [facility] but she is refusing proning position. She is turning side to side.

## 2019-10-13 NOTE — Progress Notes (Signed)
MEWS/VS Documentation      10/13/2019 0515 10/13/2019 0520 10/13/2019 0556 10/13/2019 0806   MEWS Score:  3  1  1  2    MEWS Score Color:  Yellow  Green  Green  Yellow   Resp:  (!) 27  20  (!) 24  (!) 27   Pulse:  70  61  68  74   BP:  --  --  (!) 131/55  (!) 145/91   Temp:  --  --  97.7 F (36.5 C)  97.9 F (36.6 C)   O2 Device:  HFNC  HFNC  HFNC  HFNC   O2 Flow Rate (L/min):  11 L/min  11 L/min  --  10 L/min    RR increased to 27. This is not an acute change in patient's condition. MD aware. Will continue to monitor.

## 2019-10-14 ENCOUNTER — Inpatient Hospital Stay (HOSPITAL_COMMUNITY): Payer: PPO

## 2019-10-14 LAB — CBC WITH DIFFERENTIAL/PLATELET
Abs Immature Granulocytes: 0.09 10*3/uL — ABNORMAL HIGH (ref 0.00–0.07)
Basophils Absolute: 0 10*3/uL (ref 0.0–0.1)
Basophils Relative: 0 %
Eosinophils Absolute: 0 10*3/uL (ref 0.0–0.5)
Eosinophils Relative: 0 %
HCT: 28.1 % — ABNORMAL LOW (ref 36.0–46.0)
Hemoglobin: 8.1 g/dL — ABNORMAL LOW (ref 12.0–15.0)
Immature Granulocytes: 1 %
Lymphocytes Relative: 7 %
Lymphs Abs: 0.7 10*3/uL (ref 0.7–4.0)
MCH: 20.1 pg — ABNORMAL LOW (ref 26.0–34.0)
MCHC: 28.8 g/dL — ABNORMAL LOW (ref 30.0–36.0)
MCV: 69.9 fL — ABNORMAL LOW (ref 80.0–100.0)
Monocytes Absolute: 0.4 10*3/uL (ref 0.1–1.0)
Monocytes Relative: 4 %
Neutro Abs: 9.7 10*3/uL — ABNORMAL HIGH (ref 1.7–7.7)
Neutrophils Relative %: 88 %
Platelets: 306 10*3/uL (ref 150–400)
RBC: 4.02 MIL/uL (ref 3.87–5.11)
RDW: 20.3 % — ABNORMAL HIGH (ref 11.5–15.5)
WBC: 10.9 10*3/uL — ABNORMAL HIGH (ref 4.0–10.5)
nRBC: 2.1 % — ABNORMAL HIGH (ref 0.0–0.2)

## 2019-10-14 LAB — GLUCOSE, CAPILLARY
Glucose-Capillary: 229 mg/dL — ABNORMAL HIGH (ref 70–99)
Glucose-Capillary: 237 mg/dL — ABNORMAL HIGH (ref 70–99)
Glucose-Capillary: 268 mg/dL — ABNORMAL HIGH (ref 70–99)
Glucose-Capillary: 181 mg/dL — ABNORMAL HIGH (ref 70–99)

## 2019-10-14 LAB — COMPREHENSIVE METABOLIC PANEL WITH GFR
ALT: 21 U/L (ref 0–44)
AST: 33 U/L (ref 15–41)
Albumin: 2.8 g/dL — ABNORMAL LOW (ref 3.5–5.0)
Alkaline Phosphatase: 102 U/L (ref 38–126)
Anion gap: 10 (ref 5–15)
BUN: 23 mg/dL (ref 8–23)
CO2: 25 mmol/L (ref 22–32)
Calcium: 8.4 mg/dL — ABNORMAL LOW (ref 8.9–10.3)
Chloride: 104 mmol/L (ref 98–111)
Creatinine, Ser: 1.18 mg/dL — ABNORMAL HIGH (ref 0.44–1.00)
GFR calc Af Amer: 48 mL/min — ABNORMAL LOW
GFR calc non Af Amer: 42 mL/min — ABNORMAL LOW
Glucose, Bld: 244 mg/dL — ABNORMAL HIGH (ref 70–99)
Potassium: 3.8 mmol/L (ref 3.5–5.1)
Sodium: 139 mmol/L (ref 135–145)
Total Bilirubin: 0.4 mg/dL (ref 0.3–1.2)
Total Protein: 6.3 g/dL — ABNORMAL LOW (ref 6.5–8.1)

## 2019-10-14 LAB — FERRITIN: Ferritin: 29 ng/mL (ref 11–307)

## 2019-10-14 LAB — PHOSPHORUS: Phosphorus: 3.4 mg/dL (ref 2.5–4.6)

## 2019-10-14 LAB — C-REACTIVE PROTEIN: CRP: 6.4 mg/dL — ABNORMAL HIGH (ref <1.0)

## 2019-10-14 LAB — D-DIMER, QUANTITATIVE: D-Dimer, Quant: 3.45 ug{FEU}/mL — ABNORMAL HIGH (ref 0.00–0.50)

## 2019-10-14 LAB — MAGNESIUM: Magnesium: 2.6 mg/dL — ABNORMAL HIGH (ref 1.7–2.4)

## 2019-10-14 MED ORDER — IRBESARTAN 300 MG PO TABS: 150.0000 mg | ORAL_TABLET | Freq: Every day | ORAL | Status: DC

## 2019-10-14 MED ORDER — FUROSEMIDE 10 MG/ML IJ SOLN
20.0000 mg | Freq: Two times a day (BID) | INTRAMUSCULAR | Status: AC
  Administered 2019-10-14 – 2019-10-16 (×4): 20 mg via INTRAVENOUS
  Filled 2019-10-14 (×4): qty 2

## 2019-10-14 MED ORDER — FUROSEMIDE 10 MG/ML IJ SOLN: 20.0000 mg | Freq: Three times a day (TID) | INTRAMUSCULAR | Status: DC

## 2019-10-14 MED ORDER — FUROSEMIDE 10 MG/ML IJ SOLN: 20.0000 mg | Freq: Two times a day (BID) | INTRAMUSCULAR | Status: DC

## 2019-10-14 MED ORDER — IOHEXOL 350 MG/ML SOLN
100.0000 mL | Freq: Once | INTRAVENOUS | Status: AC | PRN
  Administered 2019-10-14: 100 mL via INTRAVENOUS

## 2019-10-14 MED ORDER — INSULIN DETEMIR 100 UNIT/ML ~~LOC~~ SOLN
6.0000 [IU] | Freq: Two times a day (BID) | SUBCUTANEOUS | Status: DC
  Administered 2019-10-14 – 2019-10-15 (×3): 6 [IU] via SUBCUTANEOUS
  Filled 2019-10-14 (×4): qty 0.06

## 2019-10-14 NOTE — Progress Notes (Signed)
Transferred pt from bed to [facility]. Pt's WOB increased significantly and saturations dropped to 70's. Patient's HFNC increased to 14 L and pt instructed to lay on side, as she is refusing proning position.

## 2019-10-14 NOTE — Progress Notes (Signed)
Inpatient Diabetes Program Recommendations  AACE/ADA: New Consensus Statement on Inpatient Glycemic Control (2015)  Target Ranges:  Prepandial:   less than 140 mg/dL      Peak postprandial:   less than 180 mg/dL (1-2 hours)      Critically ill patients:  140 - 180 mg/dL   Lab Results  Component Value Date   GLUCAP 237 (H) 10/14/2019   HGBA1C 6.6 (H) 10/11/2019    Review of Glycemic Control Results for SALLYE, HENNER (MRN [id number]) as of 10/14/2019 12:10  Ref. Range 10/13/2019 12:33 10/13/2019 16:53 10/13/2019 21:36 10/14/2019 07:48 10/14/2019 11:57  Glucose-Capillary Latest Ref Range: 70 - 99 mg/dL 169 (H) 170 (H) 155 (H) 229 (H) 237 (H)   Diabetes history: DM 2 Outpatient Diabetes medications:  Metformin 1000 mg q HS Current orders for Inpatient glycemic control:  Novolog moderate tid with meals Solumedrol 40 mg IV q 8 hours Inpatient Diabetes Program Recommendations:    May consider adding Levemir 6 units bid.   Thanks  Adah Perl, RN, BC-ADM Inpatient Diabetes Coordinator Pager [phone] (8a-5p)

## 2019-10-14 NOTE — Progress Notes (Signed)
Patient 89-90% on 14L HFNC. Patient instructed to prone and was initially agreeable. Got patient to proning position and patient stated it was too uncomfortable and "inconvinent" to be laying on her stomach. Patient stated she would rather die than have to lay on her stomach/side.

## 2019-10-14 NOTE — Progress Notes (Signed)
PROGRESS NOTE  Debra Acosta [id number] DOB: 1931-04-15 DOA: 10/11/2019 PCP: Lucianne Lei, MD  HPI/Recap of past 10 hours: 86 year old with history of HTN, HLD, DM2, morbid obesity, CAD status post stent presented with shortness of breath.  In the ER patient was noted to be significantly hypoxic requiring high flow nasal cannula, 60% on room air, COVID-19 screening test positive on 10/11/2019.  Chest x-ray with bilateral pulmonary infiltrates.  TRH asked to admit for COVID-19 viral pneumonia.  Started on COVID-19 directed therapies.  CTA PE negative for PE on 10/14/2019.  10/14/19: Seen and examined.  Still hypoxic and requiring HFNC.  Ongoing diuresing and therapies. She states she feels better but easily gets short of breath with minimal movement.   Assessment/Plan: Principal Problem:   Acute hypoxemic respiratory failure due to COVID-19 [facility]) Active Problems:   Morbid obesity ([place])   DM2 (diabetes mellitus, type 2) ([place])   Dyslipidemia   Hypertension   Diabetes mellitus without complication ([place])   Coronary artery disease   Iron deficiency anemia due to chronic blood loss  Acute hypoxic respiratory failure down to 60% on room air secondary to COVID-19 viral pneumonia.   -Oxygen levels-high flow nasal cannula on 13 L with O2 saturation in the low 90s.  Not on oxygen supplementation at baseline. -Remdesivir-day 4/5.  Received Actemra on 10/12/2019.   -Continue IV Solu-Medrol, day number 3 out of 10.   -Continue incentive spirometer and flutter valve. Continue Tussionex twice daily x3 days. -Continue IV Lasix 20 mg twice daily -Pronation as tolerated -Vitamin C & Zinc, add vitamin D3. Inflammatory markers are trending down -Continue to trend inflammatory markers -Negative procalcitonin. -CODE STATUS confirmed DNR.  Essential hypertension -Hold off irbesartan to give room for diuresing Continue IV Lasix 20 mg 3 times daily x 2 days  Diabetes mellitus type 2 with  hyperglycemia -Insulin sliding scale and Accu-Chek.  Glucophage on hold Hemoglobin A1c 6.6 on 10/11/2019. Hyperglycemia likely exacerbated by IV steroids. Added Levemir 6 units twice daily. Appreciate diabetes coordinator assistance.    Coronary artery disease -Currently without chest pain -Continue aspirin and Plavix  Iron deficiency anemia  Iron studies suggestive of profound iron deficiency Continue oral iron supplement 3 times daily with bowel regimen. No overt signs of bleeding Hemoglobin improving 8.1 from 7.6 from 7.3  MCV 69. May consider IV Feraheme when hemodynamically stable.  History of COPD and asthma -Continue home bronchodilators. -Maintain O2 saturation greater than 92%.  Morbid obesity with BMI greater than 45 -Weight loss outpatient once stable.  History of glaucoma -Continue home meds   DVT prophylaxis: Lovenox subcu daily Code Status: DNR Family Communication:   Updated her daughter [name] via phone on 10/13/2019.  All questions answered to her satisfaction.   Disposition Plan:  Patient is from home.  Anticipate discharge to home once oxygen requirement is close to baseline.  Barrier to discharge continues to require high level of oxygen, currently on high flow nasal cannula 10 L.    Objective: Vitals:   10/14/19 0823 10/14/19 1003 10/14/19 1323 10/14/19 1424  BP:   (!) 139/58   Pulse: 63 61 67 64  Resp: (!) 21 18 19 18   Temp:   (!) 97.3 F (36.3 C)   TempSrc:   Oral   SpO2: 91% 96% 92% 95%  Weight:      Height:        Intake/Output Summary (Last 24 hours) at 10/14/2019 1550 Last data filed at 10/14/2019 1400 Gross per 24  hour  Intake 760 ml  Output 600 ml  Net 160 ml   Filed Weights   10/11/19 1135  Weight: 129.3 kg    Exam:  . General: 86 y.o. year-old female well-developed well-nourished no acute distress.  In bed.  On high flow nasal cannula.  Alert and oriented x3.  Cardiovascular: Regular rate and rhythm no rubs or  gallops.   Marland Kitchen Respiratory: Diffuse rales bilaterally.  No wheezing noted this time.  Poor inspiratory effort. . Abdomen: Soft nontender obese bowel sounds present.   . Musculoskeletal: Trace lower extremity bilaterally. Psychiatry: Mood is appropriate for condition and setting.  Data Reviewed: CBC: Recent Labs  Lab 10/11/19 1200 10/12/19 0310 10/12/19 0820 10/13/19 0354 10/14/19 0829  WBC 6.1 5.7  --  6.3 10.9*  NEUTROABS 5.3 5.0  --  5.4 9.7*  HGB 7.5* 7.3*  --  7.6* 8.1*  HCT 26.0* 24.9* 27.0* 26.2* 28.1*  MCV 69.9* 69.4*  --  68.9* 69.9*  PLT 224 221  --  259 [id number]   Basic Metabolic Panel: Recent Labs  Lab 10/11/19 1200 10/12/19 0310 10/13/19 0354 10/14/19 0829  NA 137 140 141 139  K 3.5 3.7 4.1 3.8  CL 105 105 106 104  CO2 24 25 25 25   GLUCOSE 143* 200* 198* 244*  BUN 17 16 22 23   CREATININE 1.33* 1.16* 1.09* 1.18*  CALCIUM 7.7* 7.8* 8.4* 8.4*  MG  --  2.3 2.6* 2.6*  PHOS  --  3.4 3.2 3.4   GFR: Estimated Creatinine Clearance: 46.4 mL/min (A) (by C-G formula based on SCr of 1.18 mg/dL (H)). Liver Function Tests: Recent Labs  Lab 10/11/19 1200 10/12/19 0310 10/13/19 0354 10/14/19 0829  AST 31 28 31  33  ALT 14 14 18 21   ALKPHOS 82 79 88 102  BILITOT 0.6 0.8 0.5 0.4  PROT 6.6 5.9* 6.2* 6.3*  ALBUMIN 3.0* 2.6* 2.7* 2.8*   No results for input(s): LIPASE, AMYLASE in the last 168 hours. No results for input(s): AMMONIA in the last 168 hours. Coagulation Profile: No results for input(s): INR, PROTIME in the last 168 hours. Cardiac Enzymes: No results for input(s): CKTOTAL, CKMB, CKMBINDEX, TROPONINI in the last 168 hours. BNP (last 3 results) No results for input(s): PROBNP in the last 8760 hours. HbA1C: No results for input(s): HGBA1C in the last 72 hours. CBG: Recent Labs  Lab 10/13/19 1233 10/13/19 1653 10/13/19 2136 10/14/19 0748 10/14/19 1157  GLUCAP 169* 170* 155* 229* 237*   Lipid Profile: No results for input(s): CHOL, HDL, LDLCALC, TRIG,  CHOLHDL, LDLDIRECT in the last 72 hours. Thyroid Function Tests: No results for input(s): TSH, T4TOTAL, FREET4, T3FREE, THYROIDAB in the last 72 hours. Anemia Panel: Recent Labs    10/12/19 0310 10/12/19 0820 10/13/19 0354 10/14/19 0829  VITAMINB12  --  371  --   --   FERRITIN   < >  --  28 29  TIBC  --  305  --   --   IRON  --  12*  --   --    < > = values in this interval not displayed.   Urine analysis: No results found for: COLORURINE, APPEARANCEUR, LABSPEC, PHURINE, GLUCOSEU, HGBUR, BILIRUBINUR, KETONESUR, PROTEINUR, UROBILINOGEN, NITRITE, LEUKOCYTESUR Sepsis Labs: @LABRCNTIP (procalcitonin:4,lacticidven:4)  ) Recent Results (from the past 240 hour(s))  Blood Culture (routine x 2)     Status: None (Preliminary result)   Collection Time: 10/11/19 11:53 AM   Specimen: BLOOD  Result Value Ref Range Status  Specimen Description BLOOD RIGHT ANTECUBITAL  Final   Special Requests   Final    BOTTLES DRAWN AEROBIC AND ANAEROBIC Blood Culture results may not be optimal due to an excessive volume of blood received in culture bottles   Culture   Final    NO GROWTH 3 DAYS Performed at [facility], [place] [address], [place], [place] [address]    Report Status PENDING  Incomplete  Respiratory Panel by RT PCR (Flu A&B, Covid) - Nasopharyngeal Swab     Status: Abnormal   Collection Time: 10/11/19 11:54 AM   Specimen: Nasopharyngeal Swab  Result Value Ref Range Status   SARS Coronavirus 2 by RT PCR POSITIVE (A) NEGATIVE Final    Comment: RESULT CALLED TO, READ BACK BY AND VERIFIED WITH: Claretta Fraise RN 13:35 10/11/19 ([email or web address]) (NOTE) SARS-CoV-2 target nucleic acids are DETECTED. SARS-CoV-2 RNA is generally detectable in upper respiratory specimens  during the acute phase of infection. Positive results are indicative of the presence of the identified virus, but do not rule out bacterial infection or co-infection with other pathogens not detected by the test. Clinical correlation  with patient history and other diagnostic information is necessary to determine patient infection status. The expected result is Negative. Fact Sheet for Patients:  PinkCheek.be Fact Sheet for Healthcare Providers: GravelBags.it This test is not yet approved or cleared by the [place] FDA and  has been authorized for detection and/or diagnosis of SARS-CoV-2 by FDA under an Emergency Use Authorization (EUA).  This EUA will remain in effect (meaning this test can be used)  for the duration of  the COVID-19 declaration under Section 564(b)(1) of the Act, 21 U.S.C. section 360bbb-3(b)(1), unless the authorization is terminated or revoked sooner.    Influenza A by PCR NEGATIVE NEGATIVE Final   Influenza B by PCR NEGATIVE NEGATIVE Final    Comment: (NOTE) The Xpert Xpress SARS-CoV-2/FLU/RSV assay is intended as an aid in  the diagnosis of influenza from Nasopharyngeal swab specimens and  should not be used as a sole basis for treatment. Nasal washings and  aspirates are unacceptable for Xpert Xpress SARS-CoV-2/FLU/RSV  testing. Fact Sheet for Patients: PinkCheek.be Fact Sheet for Healthcare Providers: GravelBags.it This test is not yet approved or cleared by the [place] FDA and  has been authorized for detection and/or diagnosis of SARS-CoV-2 by  FDA under an Emergency Use Authorization (EUA). This EUA will remain  in effect (meaning this test can be used) for the duration of the  Covid-19 declaration under Section 564(b)(1) of the Act, 21  U.S.C. section 360bbb-3(b)(1), unless the authorization is  terminated or revoked. Performed at [facility], [place] [address] [place], [place] [address]   Blood Culture (routine x 2)     Status: None (Preliminary result)   Collection Time: 10/11/19 11:58 AM   Specimen: BLOOD LEFT HAND  Result Value Ref Range Status    Specimen Description BLOOD LEFT HAND  Final   Special Requests   Final    BOTTLES DRAWN AEROBIC ONLY Blood Culture results may not be optimal due to an inadequate volume of blood received in culture bottles   Culture   Final    NO GROWTH 3 DAYS Performed at [facility], [place] [address], [place], [place] [address]    Report Status PENDING  Incomplete      Studies: CT ANGIO CHEST PE W OR WO CONTRAST  Result Date: 10/14/2019 CLINICAL DATA:  Positive for COVID. High pretest  probability for pulmonary embolism. EXAM: CT ANGIOGRAPHY CHEST WITH CONTRAST TECHNIQUE: Multidetector CT imaging of the chest was performed using the standard protocol during bolus administration of intravenous contrast. Multiplanar CT image reconstructions and MIPs were obtained to evaluate the vascular anatomy. CONTRAST:  15mL OMNIPAQUE IOHEXOL 350 MG/ML SOLN COMPARISON:  None. FINDINGS: Cardiovascular: Normal heart size. No pericardial effusion. Extensive aortic and coronary atherosclerosis. There is irregular plaque along the aortic arch with a leftward projecting small aneurysm measuring 7 mm base to dome. Limited by motion artifact and bolus dispersion. No evidence of pulmonary embolism to the lobar/proximal segmental level. Mediastinum/Nodes: Negative for adenopathy or mass. Lungs/Pleura: Patchy ground-glass opacity throughout the bilateral lungs. Intervening unaffected lung has a normal appearance with no [employer]. No effusion or pneumothorax Upper Abdomen: Right adrenal mass. Suspect this is an adenoma, but not mentioned on abdominal CT report from 2001. Musculoskeletal: Spondylosis with bridging osteophytes. Bulky endplate spurring at the lower cervical spine with severe paring spinal stenosis on axial slices. No acute or aggressive finding. Review of the MIP images confirms the above findings. IMPRESSION: 1. Limited CTA with no evidence of pulmonary embolism. Diagnostic certainty significantly declines beyond the  proximal segmental level. 2. Ground-glass pneumonia consistent with COVID-19 positivity. 3. Aortic Atherosclerosis (ICD10-I70.0) with a small aneurysmal outpouching leftward from the aortic isthmus. 4. Spondyloarthropathy. Bulky endplate spurring in the lower cervical spine causes prominent spinal stenosis. 5. 2.4 cm right adrenal mass most often an adenoma in a patient without malignancy history. Electronically Signed   By: Monte Fantasia M.D.   On: 10/14/2019 11:47    Scheduled Meds: . albuterol  2 puff Inhalation Q6H  . vitamin C  500 mg Oral Daily  . aspirin EC  81 mg Oral Daily  . brimonidine  1 drop Both Eyes TID  . chlorpheniramine-HYDROcodone  5 mL Oral Q12H  . cholecalciferol  1,000 Units Oral Daily  . clopidogrel  75 mg Oral Daily  . dorzolamide  1 drop Both Eyes TID  . enoxaparin (LOVENOX) injection  65 mg Subcutaneous Q24H  . ferrous sulfate  325 mg Oral TID WC  . furosemide  20 mg Intravenous BID  . insulin aspart  0-15 Units Subcutaneous TID WC  . insulin detemir  6 Units Subcutaneous BID  . ipratropium  2 puff Inhalation Q6H  . irbesartan  150 mg Oral Daily  . latanoprost  1 drop Both Eyes QHS  . methylPREDNISolone (SOLU-MEDROL) injection  40 mg Intravenous Q8H  . mometasone-formoterol  2 puff Inhalation BID  . montelukast  10 mg Oral QHS  . sodium chloride flush  3 mL Intravenous Q12H  . zinc sulfate  220 mg Oral Daily    Continuous Infusions: . sodium chloride    . remdesivir 100 mg in NS 100 mL 100 mg (10/14/19 0844)     LOS: 3 days     Kayleen Memos, MD [facility] Pager [phone]  If 7PM-7AM, please contact night-coverage [email or web address] Password [email or web address] 10/14/2019, 3:50 PM

## 2019-10-14 NOTE — Progress Notes (Signed)
Patient's HFNC turned up to 15LPM with visible work of breathing. Patient placed on left side as she continues to refuse proning. Dr. Nevada Crane made aware.

## 2019-10-15 DIAGNOSIS — J9601 Acute respiratory failure with hypoxia: Secondary | ICD-10-CM

## 2019-10-15 DIAGNOSIS — I2583 Coronary atherosclerosis due to lipid rich plaque: Secondary | ICD-10-CM

## 2019-10-15 DIAGNOSIS — U071 COVID-19: Principal | ICD-10-CM

## 2019-10-15 DIAGNOSIS — J42 Unspecified chronic bronchitis: Secondary | ICD-10-CM

## 2019-10-15 DIAGNOSIS — D500 Iron deficiency anemia secondary to blood loss (chronic): Secondary | ICD-10-CM

## 2019-10-15 DIAGNOSIS — I2510 Atherosclerotic heart disease of native coronary artery without angina pectoris: Secondary | ICD-10-CM

## 2019-10-15 DIAGNOSIS — E119 Type 2 diabetes mellitus without complications: Secondary | ICD-10-CM

## 2019-10-15 LAB — FERRITIN: Ferritin: 26 ng/mL (ref 11–307)

## 2019-10-15 LAB — COMPREHENSIVE METABOLIC PANEL
ALT: 21 U/L (ref 0–44)
AST: 27 U/L (ref 15–41)
Albumin: 2.7 g/dL — ABNORMAL LOW (ref 3.5–5.0)
Alkaline Phosphatase: 101 U/L (ref 38–126)
Anion gap: 13 (ref 5–15)
BUN: 28 mg/dL — ABNORMAL HIGH (ref 8–23)
CO2: 24 mmol/L (ref 22–32)
Calcium: 8.2 mg/dL — ABNORMAL LOW (ref 8.9–10.3)
Chloride: 103 mmol/L (ref 98–111)
Creatinine, Ser: 1.22 mg/dL — ABNORMAL HIGH (ref 0.44–1.00)
GFR calc Af Amer: 46 mL/min — ABNORMAL LOW (ref >60)
GFR calc non Af Amer: 40 mL/min — ABNORMAL LOW (ref >60)
Glucose, Bld: 181 mg/dL — ABNORMAL HIGH (ref 70–99)
Potassium: 4.4 mmol/L (ref 3.5–5.1)
Sodium: 140 mmol/L (ref 135–145)
Total Bilirubin: 0.5 mg/dL (ref 0.3–1.2)
Total Protein: 6.2 g/dL — ABNORMAL LOW (ref 6.5–8.1)

## 2019-10-15 LAB — CBC WITH DIFFERENTIAL/PLATELET
Abs Immature Granulocytes: 0.11 10*3/uL — ABNORMAL HIGH (ref 0.00–0.07)
Basophils Absolute: 0 10*3/uL (ref 0.0–0.1)
Basophils Relative: 0 %
Eosinophils Absolute: 0 10*3/uL (ref 0.0–0.5)
Eosinophils Relative: 0 %
HCT: 28.1 % — ABNORMAL LOW (ref 36.0–46.0)
Hemoglobin: 8.1 g/dL — ABNORMAL LOW (ref 12.0–15.0)
Immature Granulocytes: 1 %
Lymphocytes Relative: 5 %
Lymphs Abs: 0.6 10*3/uL — ABNORMAL LOW (ref 0.7–4.0)
MCH: 20.2 pg — ABNORMAL LOW (ref 26.0–34.0)
MCHC: 28.8 g/dL — ABNORMAL LOW (ref 30.0–36.0)
MCV: 70.1 fL — ABNORMAL LOW (ref 80.0–100.0)
Monocytes Absolute: 0.6 10*3/uL (ref 0.1–1.0)
Monocytes Relative: 5 %
Neutro Abs: 11.1 10*3/uL — ABNORMAL HIGH (ref 1.7–7.7)
Neutrophils Relative %: 89 %
Platelets: 357 10*3/uL (ref 150–400)
RBC: 4.01 MIL/uL (ref 3.87–5.11)
RDW: 21.5 % — ABNORMAL HIGH (ref 11.5–15.5)
WBC: 12.4 10*3/uL — ABNORMAL HIGH (ref 4.0–10.5)
nRBC: 2.2 % — ABNORMAL HIGH (ref 0.0–0.2)

## 2019-10-15 LAB — GLUCOSE, CAPILLARY
Glucose-Capillary: 188 mg/dL — ABNORMAL HIGH (ref 70–99)
Glucose-Capillary: 252 mg/dL — ABNORMAL HIGH (ref 70–99)
Glucose-Capillary: 261 mg/dL — ABNORMAL HIGH (ref 70–99)
Glucose-Capillary: 177 mg/dL — ABNORMAL HIGH (ref 70–99)
Glucose-Capillary: 200 mg/dL — ABNORMAL HIGH (ref 70–99)

## 2019-10-15 LAB — MRSA PCR SCREENING: MRSA by PCR: NEGATIVE

## 2019-10-15 LAB — STREP PNEUMONIAE URINARY ANTIGEN: Strep Pneumo Urinary Antigen: NEGATIVE

## 2019-10-15 LAB — PHOSPHORUS: Phosphorus: 4.1 mg/dL (ref 2.5–4.6)

## 2019-10-15 LAB — MAGNESIUM: Magnesium: 2.6 mg/dL — ABNORMAL HIGH (ref 1.7–2.4)

## 2019-10-15 LAB — C-REACTIVE PROTEIN: CRP: 4.6 mg/dL — ABNORMAL HIGH (ref <1.0)

## 2019-10-15 LAB — D-DIMER, QUANTITATIVE: D-Dimer, Quant: 3.04 ug/mL-FEU — ABNORMAL HIGH (ref 0.00–0.50)

## 2019-10-15 MED ORDER — ALBUTEROL SULFATE HFA 108 (90 BASE) MCG/ACT IN AERS
1.0000 | INHALATION_SPRAY | RESPIRATORY_TRACT | Status: DC | PRN
  Administered 2019-10-22: 1 via RESPIRATORY_TRACT
  Filled 2019-10-15: qty 6.7

## 2019-10-15 MED ORDER — INSULIN DETEMIR 100 UNIT/ML ~~LOC~~ SOLN
8.0000 [IU] | Freq: Two times a day (BID) | SUBCUTANEOUS | Status: DC
  Administered 2019-10-15 – 2019-10-18 (×6): 8 [IU] via SUBCUTANEOUS
  Filled 2019-10-15 (×9): qty 0.08

## 2019-10-15 MED ORDER — SODIUM CHLORIDE 0.9 % IV SOLN
2.0000 g | INTRAVENOUS | Status: AC
  Administered 2019-10-15 – 2019-10-21 (×7): 2 g via INTRAVENOUS
  Filled 2019-10-15 (×7): qty 20

## 2019-10-15 MED ORDER — SODIUM CHLORIDE 0.9 % IV SOLN
500.0000 mg | INTRAVENOUS | Status: AC
  Administered 2019-10-15 – 2019-10-17 (×3): 500 mg via INTRAVENOUS
  Filled 2019-10-15 (×4): qty 500

## 2019-10-15 MED ORDER — LINAGLIPTIN 5 MG PO TABS
5.0000 mg | ORAL_TABLET | Freq: Every day | ORAL | Status: DC
  Administered 2019-10-15 – 2019-10-24 (×10): 5 mg via ORAL
  Filled 2019-10-15 (×11): qty 1

## 2019-10-15 NOTE — Progress Notes (Addendum)
PROGRESS NOTE  Debra Acosta [id number] DOB: 1931/07/01 DOA: 10/11/2019 PCP: Lucianne Lei, MD  HPI/Recap of past 66 hours: 86 year old with history of HTN, HLD, DM2, morbid obesity, CAD status post stent presented with shortness of breath.  In the ER patient was noted to be significantly hypoxic requiring high flow nasal cannula, 60% on room air, COVID-19 screening test positive on 10/11/2019.  Chest x-ray with bilateral pulmonary infiltrates.  TRH asked to admit for COVID-19 viral pneumonia.  Started on COVID-19 directed therapies.  CTA PE negative for PE on 10/14/2019. Antibiotics started 1/30 as procalcitonin elevated (0.26) on admission.    10/15/19: Started ceftriaxone + azithromycin   Assessment/Plan: Principal Problem:   Acute hypoxemic respiratory failure due to COVID-19 [facility]) Active Problems:   Morbid obesity ([place])   DM2 (diabetes mellitus, type 2) ([place])   Dyslipidemia   Hypertension   Diabetes mellitus without complication ([place])   Coronary artery disease   Iron deficiency anemia due to chronic blood loss  Acute hypoxic respiratory failure down to 60% on room air secondary to COVID-19 viral pneumonia, concern for superimposed bacterial pneumonia given elevated procalcitonin. -Requiring heated high flow at 15 L. -Remdesivir-day 5/5.  Received Actemra on 10/12/2019.   -Continue IV Solu-Medrol, day number 4 out of 10.   -Continue incentive spirometer and flutter valve. Continue Tussionex twice daily x3 days. -Continue IV Lasix 20 mg twice daily -Discussed pronation and patient declined again today. -Procalcitonin is elevated, start antibiotics.  Azithromycin 500 mg for 3 days and ceftriaxone 2 g, discussed with pharmacy and with patient.  She has a history of possibly a skin reaction to penicillin.  Ceftriaxone ordered as this is a third-generation cephalosporin. --MRSA PCR and strep antigen ordered follow-up results.  If MRSA PCR is positive would recommend transitioning  to vancomycin. Updated: MRSA negative, continue current therapy.  - Sputum culture  -Vitamin C & Zinc, vitamin D3. - Trend inflammatory markers, downtrending  -CODE STATUS confirmed DNR again today.  -Discussed concern for severe illness with daughter [name] in the patient.  If worsens overnight recommend consulting critical care to see if any additional therapies are recommended.  Essential hypertension -Hold off irbesartan to give room for diuresing Continue IV Lasix 20 mg 3 times daily x 2 days  Diabetes mellitus type 2 with hyperglycemia -Insulin sliding scale and Accu-Chek.  Glucophage on hold Hemoglobin A1c 6.6 on 10/11/2019. Hyperglycemia likely exacerbated by IV steroids. Added Levemir 8 units twice daily + lispro  Added linagliptin  Appreciate diabetes coordinator assistance.    Coronary artery disease -Currently without chest pain -Continue aspirin and Plavix  Iron deficiency anemia  Ferritin is low, on iron supplement, continue to monitor Hgb  Stable Recommend outpatient evaluation with colonoscopy, consideration of EGD    History of COPD and asthma -Continue home bronchodilators. -Maintain O2 saturation greater than 92%.  Morbid obesity with BMI greater than 45, affecting severity of illness.   History of glaucoma -Continue home meds  Adrenal adenoma, consider evaluation for overproduction of hormones as an outpatient.  DVT prophylaxis: Lovenox subcu daily Code Status: DNR Family Communication:   Updated her daughter [name] today 85 January.   Disposition Plan:  Patient is from home.  Anticipate she may need SNF.  Barrier to discharge continues to require high level of oxygen, currently on high flow nasal cannula 2 L, recommend PT evaluation prior to discharge a suspect in the setting of her deconditioning she may need skilled nursing placement.  Reports she feels okay  today.  She feels her breathing is not improved at all.  She gets dyspneic  whenever she turns over.  She denies chest pain.  She does endorse a cough. Objective: Vitals:   10/15/19 0832 10/15/19 0935 10/15/19 1318 10/15/19 1503  BP:   (!) 136/59   Pulse: 74  78 66  Resp: (!) 21  17 17   Temp:   99 F (37.2 C)   TempSrc:   Oral   SpO2: (!) 89% 90% 94% 97%  Weight:      Height:        Intake/Output Summary (Last 24 hours) at 10/15/2019 1512 Last data filed at 10/15/2019 1456 Gross per 24 hour  Intake 810 ml  Output 1200 ml  Net -390 ml   Filed Weights   10/11/19 1135  Weight: 129.3 kg    Exam:  . General: 86 y.o. year-old female no distress but tachypneic..  In bed.  On high flow nasal cannula.  Alert and oriented x3.  Cardiovascular: Regular rate and rhythm no rubs or gallops.   Marland Kitchen Respiratory: Diffuse rales bilaterally.  No wheezing noted this time.  Poor inspiratory effort. . Abdomen: Soft nontender obese bowel sounds present.   . Musculoskeletal: Trace lower extremity bilaterally. Psychiatry: Mood is appropriate for condition and setting.  Data Reviewed: CBC: Recent Labs  Lab 10/11/19 1200 10/11/19 1200 10/12/19 0310 10/12/19 0820 10/13/19 0354 10/14/19 0829 10/15/19 0500  WBC 6.1  --  5.7  --  6.3 10.9* 12.4*  NEUTROABS 5.3  --  5.0  --  5.4 9.7* 11.1*  HGB 7.5*  --  7.3*  --  7.6* 8.1* 8.1*  HCT 26.0*   < > 24.9* 27.0* 26.2* 28.1* 28.1*  MCV 69.9*  --  69.4*  --  68.9* 69.9* 70.1*  PLT 224  --  221  --  259 306 357   < > = values in this interval not displayed.   Basic Metabolic Panel: Recent Labs  Lab 10/11/19 1200 10/12/19 0310 10/13/19 0354 10/14/19 0829 10/15/19 0500  NA 137 140 141 139 140  K 3.5 3.7 4.1 3.8 4.4  CL 105 105 106 104 103  CO2 24 25 25 25 24   GLUCOSE 143* 200* 198* 244* 181*  BUN 17 16 22 23  28*  CREATININE 1.33* 1.16* 1.09* 1.18* 1.22*  CALCIUM 7.7* 7.8* 8.4* 8.4* 8.2*  MG  --  2.3 2.6* 2.6* 2.6*  PHOS  --  3.4 3.2 3.4 4.1  Liver Function Tests: Recent Labs  Lab 10/11/19 1200 10/12/19 0310  10/13/19 0354 10/14/19 0829 10/15/19 0500  AST 31 28 31  33 27  ALT 14 14 18 21 21   ALKPHOS 82 79 88 102 101  BILITOT 0.6 0.8 0.5 0.4 0.5  PROT 6.6 5.9* 6.2* 6.3* 6.2*  ALBUMIN 3.0* 2.6* 2.7* 2.8* 2.7*    Studies: No results found.  Scheduled Meds: . albuterol  2 puff Inhalation Q6H  . vitamin C  500 mg Oral Daily  . aspirin EC  81 mg Oral Daily  . brimonidine  1 drop Both Eyes TID  . chlorpheniramine-HYDROcodone  5 mL Oral Q12H  . cholecalciferol  1,000 Units Oral Daily  . clopidogrel  75 mg Oral Daily  . dorzolamide  1 drop Both Eyes TID  . enoxaparin (LOVENOX) injection  65 mg Subcutaneous Q24H  . ferrous sulfate  325 mg Oral TID WC  . furosemide  20 mg Intravenous BID  . insulin aspart  0-15 Units Subcutaneous TID  WC  . insulin detemir  8 Units Subcutaneous BID  . ipratropium  2 puff Inhalation Q6H  . latanoprost  1 drop Both Eyes QHS  . methylPREDNISolone (SOLU-MEDROL) injection  40 mg Intravenous Q8H  . mometasone-formoterol  2 puff Inhalation BID  . montelukast  10 mg Oral QHS  . sodium chloride flush  3 mL Intravenous Q12H  . zinc sulfate  220 mg Oral Daily    Continuous Infusions: . sodium chloride    . azithromycin 500 mg (10/15/19 1302)  . cefTRIAXone (ROCEPHIN)  IV 2 g (10/15/19 1215)     LOS: 4 days    Total time >35 minutes.    Martyn Malay, MD [facility] Pager [phone]  If 7PM-7AM, please contact night-coverage [email or web address] Password [email or web address] 10/15/2019, 3:12 PM

## 2019-10-16 LAB — CBC WITH DIFFERENTIAL/PLATELET
Abs Immature Granulocytes: 0.18 10*3/uL — ABNORMAL HIGH (ref 0.00–0.07)
Basophils Absolute: 0 10*3/uL (ref 0.0–0.1)
Basophils Relative: 0 %
Eosinophils Absolute: 0 10*3/uL (ref 0.0–0.5)
Eosinophils Relative: 0 %
HCT: 30.5 % — ABNORMAL LOW (ref 36.0–46.0)
Hemoglobin: 8.8 g/dL — ABNORMAL LOW (ref 12.0–15.0)
Immature Granulocytes: 2 %
Lymphocytes Relative: 4 %
Lymphs Abs: 0.5 10*3/uL — ABNORMAL LOW (ref 0.7–4.0)
MCH: 20.4 pg — ABNORMAL LOW (ref 26.0–34.0)
MCHC: 28.9 g/dL — ABNORMAL LOW (ref 30.0–36.0)
MCV: 70.8 fL — ABNORMAL LOW (ref 80.0–100.0)
Monocytes Absolute: 0.7 10*3/uL (ref 0.1–1.0)
Monocytes Relative: 7 %
Neutro Abs: 9.7 10*3/uL — ABNORMAL HIGH (ref 1.7–7.7)
Neutrophils Relative %: 87 %
Platelets: 351 10*3/uL (ref 150–400)
RBC: 4.31 MIL/uL (ref 3.87–5.11)
RDW: 21.8 % — ABNORMAL HIGH (ref 11.5–15.5)
WBC: 11.1 10*3/uL — ABNORMAL HIGH (ref 4.0–10.5)
nRBC: 1.6 % — ABNORMAL HIGH (ref 0.0–0.2)

## 2019-10-16 LAB — COMPREHENSIVE METABOLIC PANEL
ALT: 20 U/L (ref 0–44)
AST: 21 U/L (ref 15–41)
Albumin: 2.7 g/dL — ABNORMAL LOW (ref 3.5–5.0)
Alkaline Phosphatase: 97 U/L (ref 38–126)
Anion gap: 11 (ref 5–15)
BUN: 31 mg/dL — ABNORMAL HIGH (ref 8–23)
CO2: 30 mmol/L (ref 22–32)
Calcium: 8.3 mg/dL — ABNORMAL LOW (ref 8.9–10.3)
Chloride: 103 mmol/L (ref 98–111)
Creatinine, Ser: 1.31 mg/dL — ABNORMAL HIGH (ref 0.44–1.00)
GFR calc Af Amer: 43 mL/min — ABNORMAL LOW (ref >60)
GFR calc non Af Amer: 37 mL/min — ABNORMAL LOW (ref >60)
Glucose, Bld: 213 mg/dL — ABNORMAL HIGH (ref 70–99)
Potassium: 4.9 mmol/L (ref 3.5–5.1)
Sodium: 144 mmol/L (ref 135–145)
Total Bilirubin: 0.5 mg/dL (ref 0.3–1.2)
Total Protein: 6 g/dL — ABNORMAL LOW (ref 6.5–8.1)

## 2019-10-16 LAB — CULTURE, BLOOD (ROUTINE X 2)
Culture: NO GROWTH
Culture: NO GROWTH

## 2019-10-16 LAB — D-DIMER, QUANTITATIVE: D-Dimer, Quant: 2.87 ug/mL-FEU — ABNORMAL HIGH (ref 0.00–0.50)

## 2019-10-16 LAB — GLUCOSE, CAPILLARY
Glucose-Capillary: 204 mg/dL — ABNORMAL HIGH (ref 70–99)
Glucose-Capillary: 222 mg/dL — ABNORMAL HIGH (ref 70–99)
Glucose-Capillary: 175 mg/dL — ABNORMAL HIGH (ref 70–99)
Glucose-Capillary: 179 mg/dL — ABNORMAL HIGH (ref 70–99)

## 2019-10-16 LAB — FERRITIN: Ferritin: 24 ng/mL (ref 11–307)

## 2019-10-16 LAB — PHOSPHORUS: Phosphorus: 4.4 mg/dL (ref 2.5–4.6)

## 2019-10-16 LAB — MAGNESIUM: Magnesium: 2.8 mg/dL — ABNORMAL HIGH (ref 1.7–2.4)

## 2019-10-16 LAB — C-REACTIVE PROTEIN: CRP: 3 mg/dL — ABNORMAL HIGH (ref <1.0)

## 2019-10-16 NOTE — Progress Notes (Signed)
PROGRESS NOTE    Debra Acosta  [id number] DOB: 21-Jan-1931 DOA: 10/11/2019 PCP: Lucianne Lei, MD    Brief Narrative:  86 year old with history of HTN, HLD, DM2, morbid obesity, CAD status post stent presented with shortness of breath. In the ER patient was noted to be significantly hypoxic requiring high flow nasal cannula, 60% on room air, COVID-19 screening test positive on 10/11/2019.  Chest x-ray with bilateral pulmonary infiltrates.  TRH asked to admit for COVID-19 viral pneumonia.  Started on COVID-19 directed therapies.  CTA PE negative for PE on 10/14/2019. Antibiotics started 1/30 as procalcitonin elevated (0.26) on admission.    10/15/19: Started ceftriaxone + azithromycin   Assessment & Plan:   Principal Problem:   Acute hypoxemic respiratory failure due to COVID-19 [facility]) Active Problems:   Morbid obesity ([place])   DM2 (diabetes mellitus, type 2) ([place])   Dyslipidemia   Hypertension   Diabetes mellitus without complication ([place])   Coronary artery disease   Iron deficiency anemia due to chronic blood loss  Acute hypoxic respiratory failure down to 60% on room air secondary to COVID-19 viral pneumonia, concern for superimposed bacterial pneumonia given elevated procalcitonin. -Requiring heated high flow at 15 L. -Remdesivir-day 5/5.  Received Actemra on 10/12/2019.   -Continue IV Solu-Medrol, day number 4 out of 10.   -Continue incentive spirometer and flutter valve. Continue Tussionex twice daily x3 days. -Continue IV Lasix 20 mg twice daily -Discussed pronation -Procalcitonin is elevated, start antibiotics.  Azithromycin 500 mg for 3 days and ceftriaxone 2 g, discussed with pharmacy and with patient.  She has a history of possibly a skin reaction to penicillin.  Ceftriaxone ordered as this is a third-generation cephalosporin. --MRSA PCR and strep antigen ordered follow-up results.  If MRSA PCR is positive would recommend transitioning to vancomycin. Updated: MRSA  negative, continue current therapy.  - Sputum culture  -Vitamin C &Zinc, vitamin D3. - Trend inflammatory markers, downtrending  -CODE STATUS confirmed DNR again today.  -Discussed concern for severe illness with daughter [name] in the patient.  If worsens overnight recommend consulting critical care to see if any additional therapies are recommended. Seems to be improving after addition of Abx. Oxygen requirement is down some. Proned overnight.  Essential hypertension -Hold off irbesartan to give room for diuresing Continue IV Lasix 20 mg 3 times daily x 2 days  Diabetes mellitus type 2 with hyperglycemia -Insulin sliding scale and Accu-Chek. Glucophage on hold Hemoglobin A1c 6.6 on 10/11/2019. Hyperglycemia likely exacerbated by IV steroids. Added Levemir 8 units twice daily + lispro  Added linagliptin  Appreciate diabetes coordinator assistance.    Coronary artery disease -Currently without chest pain -Continue aspirin and Plavix  Iron deficiency anemia  Ferritin is low, on iron supplement, continue to monitor Hgb  Stable Recommend outpatient evaluation with colonoscopy, consideration of EGD    History of COPD and asthma -Continue home bronchodilators. -Maintain O2 saturation greater than 92%.  Morbid obesity with BMI greater than 45, affecting severity of illness.   History of glaucoma -Continue home meds  Adrenal adenoma, consider evaluation for overproduction of hormones as an outpatient.  DVT prophylaxis: Lovenox SQ Code Status: DNR  Family Communication: Daughter [name] Disposition Plan: Patient is from home.  Anticipate she may need SNF.  Barrier to discharge continues to require high level of oxygen, currently on high flow nasal cannula 13 L, recommend PT evaluation prior to discharge a suspect in the setting of her deconditioning she may need skilled nursing placement.  Consultants:    None  Procedures:  None  Antimicrobials: Anti-infectives (From admission, onward)   Start     Dose/Rate Route Frequency Ordered Stop   10/15/19 1230  azithromycin (ZITHROMAX) 500 mg in sodium chloride 0.9 % 250 mL IVPB     500 mg 250 mL/hr over 60 Minutes Intravenous Every 24 hours 10/15/19 1143 10/18/19 1229   10/15/19 1230  cefTRIAXone (ROCEPHIN) 2 g in sodium chloride 0.9 % 100 mL IVPB     2 g 200 mL/hr over 30 Minutes Intravenous Every 24 hours 10/15/19 1143 10/22/19 1229   10/12/19 1000  remdesivir 100 mg in sodium chloride 0.9 % 100 mL IVPB     100 mg 200 mL/hr over 30 Minutes Intravenous Daily 10/11/19 1427 10/15/19 1145   10/11/19 1430  remdesivir 200 mg in sodium chloride 0.9% 250 mL IVPB     200 mg 580 mL/hr over 30 Minutes Intravenous Once 10/11/19 1427 10/11/19 1703       Subjective: Reports feeling better today. Sleeping comofrotably  Objective: Vitals:   10/15/19 1832 10/15/19 2128 10/16/19 0517 10/16/19 0800  BP:  (!) 141/78 137/65 139/68  Pulse: 67 77 65 65  Resp: 15 18 13 15   Temp:  (!) 97.5 F (36.4 C) 98.2 F (36.8 C)   TempSrc:  Oral Oral   SpO2: 90% 94% 98% 99%  Weight:      Height:        Intake/Output Summary (Last 24 hours) at 10/16/2019 1128 Last data filed at 10/15/2019 1456 Gross per 24 hour  Intake 450 ml  Output 550 ml  Net -100 ml   Filed Weights   10/11/19 1135  Weight: 129.3 kg    Examination:  General exam: Appears calm and comfortable, obese  Respiratory system: diffuse rales and rhonchi Cardiovascular system: S1 & S2 heard, RRR.  Gastrointestinal system: Abdomen is nondistended, soft and nontender.  Central nervous system: Alert and oriented. No focal neurological deficits. Extremities: Symmetric  Skin: No rashes Psychiatry: Judgement and insight appear normal. Mood & affect appropriate.     Data Reviewed: I have personally reviewed following labs and imaging studies  CBC: Recent Labs  Lab 10/12/19 0310  10/12/19 0820 10/13/19 0354 10/14/19 0829 10/15/19 0500 10/16/19 0259  WBC 5.7  --  6.3 10.9* 12.4* 11.1*  NEUTROABS 5.0  --  5.4 9.7* 11.1* 9.7*  HGB 7.3*  --  7.6* 8.1* 8.1* 8.8*  HCT 24.9* 27.0* 26.2* 28.1* 28.1* 30.5*  MCV 69.4*  --  68.9* 69.9* 70.1* 70.8*  PLT 221  --  259 306 357 [id number]   Basic Metabolic Panel: Recent Labs  Lab 10/12/19 0310 10/13/19 0354 10/14/19 0829 10/15/19 0500 10/16/19 0259  NA 140 141 139 140 144  K 3.7 4.1 3.8 4.4 4.9  CL 105 106 104 103 103  CO2 25 25 25 24 30   GLUCOSE 200* 198* 244* 181* 213*  BUN 16 22 23  28* 31*  CREATININE 1.16* 1.09* 1.18* 1.22* 1.31*  CALCIUM 7.8* 8.4* 8.4* 8.2* 8.3*  MG 2.3 2.6* 2.6* 2.6* 2.8*  PHOS 3.4 3.2 3.4 4.1 4.4   GFR: Estimated Creatinine Clearance: 41.8 mL/min (A) (by C-G formula based on SCr of 1.31 mg/dL (H)). Liver Function Tests: Recent Labs  Lab 10/12/19 0310 10/13/19 0354 10/14/19 0829 10/15/19 0500 10/16/19 0259  AST 28 31 33 27 21  ALT 14 18 21 21 20   ALKPHOS 79 88 102 101 97  BILITOT 0.8 0.5 0.4 0.5 0.5  PROT  5.9* 6.2* 6.3* 6.2* 6.0*  ALBUMIN 2.6* 2.7* 2.8* 2.7* 2.7*   CBG: Recent Labs  Lab 10/15/19 1212 10/15/19 1224 10/15/19 1638 10/15/19 2151 10/16/19 0805  GLUCAP 252* 261* 177* 200* 204*   Anemia Panel: Recent Labs    10/15/19 0500 10/16/19 0259  FERRITIN 26 24   Sepsis Labs: Recent Labs  Lab 10/11/19 1200 10/11/19 1353  PROCALCITON 0.26  --   LATICACIDVEN 2.2* 2.0*    Recent Results (from the past 240 hour(s))  Blood Culture (routine x 2)     Status: None (Preliminary result)   Collection Time: 10/11/19 11:53 AM   Specimen: BLOOD  Result Value Ref Range Status   Specimen Description BLOOD RIGHT ANTECUBITAL  Final   Special Requests   Final    BOTTLES DRAWN AEROBIC AND ANAEROBIC Blood Culture results may not be optimal due to an excessive volume of blood received in culture bottles   Culture   Final    NO GROWTH 4 DAYS Performed at [facility],  [place] [address] [place], [place] [address]    Report Status PENDING  Incomplete  Respiratory Panel by RT PCR (Flu A&B, Covid) - Nasopharyngeal Swab     Status: Abnormal   Collection Time: 10/11/19 11:54 AM   Specimen: Nasopharyngeal Swab  Result Value Ref Range Status   SARS Coronavirus 2 by RT PCR POSITIVE (A) NEGATIVE Final    Comment: RESULT CALLED TO, READ BACK BY AND VERIFIED WITH: Claretta Fraise RN 13:35 10/11/19 ([email or web address]) (NOTE) SARS-CoV-2 target nucleic acids are DETECTED. SARS-CoV-2 RNA is generally detectable in upper respiratory specimens  during the acute phase of infection. Positive results are indicative of the presence of the identified virus, but do not rule out bacterial infection or co-infection with other pathogens not detected by the test. Clinical correlation with patient history and other diagnostic information is necessary to determine patient infection status. The expected result is Negative. Fact Sheet for Patients:  PinkCheek.be Fact Sheet for Healthcare Providers: GravelBags.it This test is not yet approved or cleared by the [place] FDA and  has been authorized for detection and/or diagnosis of SARS-CoV-2 by FDA under an Emergency Use Authorization (EUA).  This EUA will remain in effect (meaning this test can be used)  for the duration of  the COVID-19 declaration under Section 564(b)(1) of the Act, 21 U.S.C. section 360bbb-3(b)(1), unless the authorization is terminated or revoked sooner.    Influenza A by PCR NEGATIVE NEGATIVE Final   Influenza B by PCR NEGATIVE NEGATIVE Final    Comment: (NOTE) The Xpert Xpress SARS-CoV-2/FLU/RSV assay is intended as an aid in  the diagnosis of influenza from Nasopharyngeal swab specimens and  should not be used as a sole basis for treatment. Nasal washings and  aspirates are unacceptable for Xpert Xpress SARS-CoV-2/FLU/RSV  testing. Fact Sheet for  Patients: PinkCheek.be Fact Sheet for Healthcare Providers: GravelBags.it This test is not yet approved or cleared by the [place] FDA and  has been authorized for detection and/or diagnosis of SARS-CoV-2 by  FDA under an Emergency Use Authorization (EUA). This EUA will remain  in effect (meaning this test can be used) for the duration of the  Covid-19 declaration under Section 564(b)(1) of the Act, 21  U.S.C. section 360bbb-3(b)(1), unless the authorization is  terminated or revoked. Performed at [facility], [place] [address] [place], [place] [address]   Blood Culture (routine x 2)     Status: None (Preliminary result)  Collection Time: 10/11/19 11:58 AM   Specimen: BLOOD LEFT HAND  Result Value Ref Range Status   Specimen Description BLOOD LEFT HAND  Final   Special Requests   Final    BOTTLES DRAWN AEROBIC ONLY Blood Culture results may not be optimal due to an inadequate volume of blood received in culture bottles   Culture   Final    NO GROWTH 4 DAYS Performed at [facility], [place] [address] [place], [place] [address]    Report Status PENDING  Incomplete  MRSA PCR Screening     Status: None   Collection Time: 10/15/19 12:43 PM   Specimen: Nasopharyngeal  Result Value Ref Range Status   MRSA by PCR NEGATIVE NEGATIVE Final    Comment:        The GeneXpert MRSA Assay (FDA approved for NASAL specimens only), is one component of a comprehensive MRSA colonization surveillance program. It is not intended to diagnose MRSA infection nor to guide or monitor treatment for MRSA infections. Performed at [facility], [place] [address], [place], [place] [address]       Radiology Studies: CT ANGIO CHEST PE W OR WO CONTRAST  Result Date: 10/14/2019 CLINICAL DATA:  Positive for COVID. High pretest probability for pulmonary embolism. EXAM: CT ANGIOGRAPHY CHEST WITH CONTRAST TECHNIQUE: Multidetector CT  imaging of the chest was performed using the standard protocol during bolus administration of intravenous contrast. Multiplanar CT image reconstructions and MIPs were obtained to evaluate the vascular anatomy. CONTRAST:  1110mL OMNIPAQUE IOHEXOL 350 MG/ML SOLN COMPARISON:  None. FINDINGS: Cardiovascular: Normal heart size. No pericardial effusion. Extensive aortic and coronary atherosclerosis. There is irregular plaque along the aortic arch with a leftward projecting small aneurysm measuring 7 mm base to dome. Limited by motion artifact and bolus dispersion. No evidence of pulmonary embolism to the lobar/proximal segmental level. Mediastinum/Nodes: Negative for adenopathy or mass. Lungs/Pleura: Patchy ground-glass opacity throughout the bilateral lungs. Intervening unaffected lung has a normal appearance with no [employer]. No effusion or pneumothorax Upper Abdomen: Right adrenal mass. Suspect this is an adenoma, but not mentioned on abdominal CT report from 2001. Musculoskeletal: Spondylosis with bridging osteophytes. Bulky endplate spurring at the lower cervical spine with severe paring spinal stenosis on axial slices. No acute or aggressive finding. Review of the MIP images confirms the above findings. IMPRESSION: 1. Limited CTA with no evidence of pulmonary embolism. Diagnostic certainty significantly declines beyond the proximal segmental level. 2. Ground-glass pneumonia consistent with COVID-19 positivity. 3. Aortic Atherosclerosis (ICD10-I70.0) with a small aneurysmal outpouching leftward from the aortic isthmus. 4. Spondyloarthropathy. Bulky endplate spurring in the lower cervical spine causes prominent spinal stenosis. 5. 2.4 cm right adrenal mass most often an adenoma in a patient without malignancy history. Electronically Signed   By: Monte Fantasia M.D.   On: 10/14/2019 11:47     Scheduled Meds: . albuterol  2 puff Inhalation Q6H  . vitamin C  500 mg Oral Daily  . aspirin EC  81 mg Oral Daily   . brimonidine  1 drop Both Eyes TID  . cholecalciferol  1,000 Units Oral Daily  . clopidogrel  75 mg Oral Daily  . dorzolamide  1 drop Both Eyes TID  . enoxaparin (LOVENOX) injection  65 mg Subcutaneous Q24H  . ferrous sulfate  325 mg Oral TID WC  . insulin aspart  0-15 Units Subcutaneous TID WC  . insulin detemir  8 Units Subcutaneous BID  . ipratropium  2 puff Inhalation Q6H  .  latanoprost  1 drop Both Eyes QHS  . linagliptin  5 mg Oral Daily  . methylPREDNISolone (SOLU-MEDROL) injection  40 mg Intravenous Q8H  . mometasone-formoterol  2 puff Inhalation BID  . montelukast  10 mg Oral QHS  . sodium chloride flush  3 mL Intravenous Q12H  . zinc sulfate  220 mg Oral Daily   Continuous Infusions: . sodium chloride    . azithromycin 500 mg (10/15/19 1302)  . cefTRIAXone (ROCEPHIN)  IV 2 g (10/15/19 1215)     LOS: 5 days    Donnamae Jude, MD 10/16/2019 11:28 AM [phone] [facility] If 7PM-7AM, please contact night-coverage 10/16/2019, 11:28 AM

## 2019-10-16 NOTE — Progress Notes (Signed)
Daughter, Mrs. Aline Brochure, called this RN. Patient status update given. OK per patient. Questions answered. Mrs. Aline Brochure verbalized understanding. Patient pleasant and cooperative with assessment and meds. Patient refused HS snack. Patient repositioned at this time. Patient resting in bed at this time, bed alarm on and call light within reach. Will continue to monitor.

## 2019-10-16 NOTE — Progress Notes (Signed)
Received call from central telemetry stating patient has been having 3-5 beat runs of vtach with very frequent PVC's in between. Dr. Kennon Rounds paged regarding. Will continue to monitor.  Hiram Comber, RN 10/16/2019 7:22 AM

## 2019-10-16 NOTE — Progress Notes (Signed)
While giving patient bed bath, patient kept making statements like "help me Jesus," and "I came here to get better, not worse." Explained to patient that she continues to refuse simple interventions such as side lying and proning that would improve oxygenation. Placed pillows behind patient in an attempt to have her lay on her left side. As staff walked out of room, patient removed pillows and returned to supine position.

## 2019-10-17 LAB — GLUCOSE, CAPILLARY
Glucose-Capillary: 217 mg/dL — ABNORMAL HIGH (ref 70–99)
Glucose-Capillary: 254 mg/dL — ABNORMAL HIGH (ref 70–99)
Glucose-Capillary: 199 mg/dL — ABNORMAL HIGH (ref 70–99)
Glucose-Capillary: 293 mg/dL — ABNORMAL HIGH (ref 70–99)

## 2019-10-17 NOTE — Progress Notes (Signed)
PROGRESS NOTE  Debra Acosta [id number] DOB: 1930/09/27 DOA: 10/11/2019 PCP: Lucianne Lei, MD  Brief History   86 year old with history of HTN, HLD, DM2, morbid obesity, CAD status post stent presented with shortness of breath. In the ER patient was noted to be significantly hypoxic requiring high flow nasal cannula, 60% on room air, COVID-19 screening test positive on 10/11/2019. Chest x-ray with bilateral pulmonary infiltrates. TRH asked to admit for COVID-19 viral pneumonia. Started on COVID-19 directed therapies. CTA PE negative for PE on 10/14/2019. Antibiotics started 1/30 as procalcitonin elevated (0.26) on admission.  The patient has been admitted to a telemetry bed. She is receiving IV solumedrol, and has completed a dose of remdesivir. The patient has been refusing to lie prone or lie on her side. Apparently there is a friend that she will listen to who can get her to be more compliant.   Consultants  . None  Procedures  . None  Antibiotics   Anti-infectives (From admission, onward)   Start     Dose/Rate Route Frequency Ordered Stop   10/15/19 1230  azithromycin (ZITHROMAX) 500 mg in sodium chloride 0.9 % 250 mL IVPB     500 mg 250 mL/hr over 60 Minutes Intravenous Every 24 hours 10/15/19 1143 10/17/19 1233   10/15/19 1230  cefTRIAXone (ROCEPHIN) 2 g in sodium chloride 0.9 % 100 mL IVPB     2 g 200 mL/hr over 30 Minutes Intravenous Every 24 hours 10/15/19 1143 10/22/19 1229   10/12/19 1000  remdesivir 100 mg in sodium chloride 0.9 % 100 mL IVPB     100 mg 200 mL/hr over 30 Minutes Intravenous Daily 10/11/19 1427 10/15/19 1145   10/11/19 1430  remdesivir 200 mg in sodium chloride 0.9% 250 mL IVPB     200 mg 580 mL/hr over 30 Minutes Intravenous Once 10/11/19 1427 10/11/19 1703    .  Subjective  The patient is lying quietly. No new complaints. She has been somewhat oppositional to therapies.  Objective   Vitals:  Vitals:   10/17/19 0610 10/17/19 0640  BP:     Pulse: 73 67  Resp: (!) 21 16  Temp:    SpO2: 92% 94%   Exam:  Constitutional:  . The patient is awake, alert, and oriented x 3. No acute distress. Respiratory:  . No increased work of breathing. . Rales and rhonchi throughout. Scattered wheezes. . No tactile fremitus Cardiovascular:  . Regular rate and rhythm . No murmurs, ectopy, or gallups. . No lateral PMI. No thrills. Abdomen:  . Abdomen is soft, non-tender, non-distended . Morbidly obese. . No hernias, masses, or organomegaly . Bowel sounds are distant.  Musculoskeletal:  . No cyanosis, clubbing, or edema Skin:  . No rashes, lesions, ulcers . palpation of skin: no induration or nodules Neurologic:  . CN 2-12 intact . Sensation all 4 extremities intact Psychiatric:  . Mental status: Oppositional.  I have personally reviewed the following:   Today's Data  . Vitals  Scheduled Meds: . albuterol  2 puff Inhalation Q6H  . vitamin C  500 mg Oral Daily  . aspirin EC  81 mg Oral Daily  . brimonidine  1 drop Both Eyes TID  . cholecalciferol  1,000 Units Oral Daily  . clopidogrel  75 mg Oral Daily  . dorzolamide  1 drop Both Eyes TID  . enoxaparin (LOVENOX) injection  65 mg Subcutaneous Q24H  . ferrous sulfate  325 mg Oral TID WC  . insulin aspart  0-15 Units  Subcutaneous TID WC  . insulin detemir  8 Units Subcutaneous BID  . ipratropium  2 puff Inhalation Q6H  . latanoprost  1 drop Both Eyes QHS  . linagliptin  5 mg Oral Daily  . methylPREDNISolone (SOLU-MEDROL) injection  40 mg Intravenous Q8H  . mometasone-formoterol  2 puff Inhalation BID  . montelukast  10 mg Oral QHS  . sodium chloride flush  3 mL Intravenous Q12H  . zinc sulfate  220 mg Oral Daily   Continuous Infusions: . sodium chloride    . cefTRIAXone (ROCEPHIN)  IV 2 g (10/17/19 1229)    Principal Problem:   Acute hypoxemic respiratory failure due to COVID-19 [facility]) Active Problems:   Morbid obesity ([place])   DM2 (diabetes mellitus, type 2)  ([place])   Dyslipidemia   Hypertension   Diabetes mellitus without complication ([place])   Coronary artery disease   Iron deficiency anemia due to chronic blood loss   LOS: 6 days   A & P  Acute hypoxic respiratory failure down to 60% on room air secondary to COVID-19 viral pneumonia with concern for superimposed bacterial pneumonia given elevated procalcitonin: The patient has completed Remdesivir and Actemra. She continues to receive IV solumedrol. She is refusing to pronate or lie on her side and to wear O2. However, her oxygen saturations dropped to 60% on room air. She continues to require 10L by HFNC. Continue incentive spirometer and flutter valve and Lasix. She is receiving Tussionex, and Vitamin C &Zinc, vitamin D3. As her procalcitonin is elevated she is now getting Azithromycin and ceftriaxone for possible superinfection. MRSA by PCR is negative. Urine antigen for strep pneumo is negative. Inflammatory markers are trending down. Continue Tussionex twice daily x3 days. Monitor.  Essential hypertension: Hold off irbesartan to give room for diuresing. Continue IV Lasix 20 mg 3 times daily x 2 days.  Diabetes mellitus type 2 with hyperglycemia: The patient's glucoses are elevated due to IV Steroids. She is now receiving Levemie 8 units bid with SSI and linagliptin. Appreciate diabetes coordinator assistance. Hemoglobin A1c 6.6 on 10/11/2019.   Coronary artery disease: Noted and stable. Pt is being monitored on telemetry. No chest pain. Continue ASA and Plavix.  Iron deficiency anemia: Ferritin is low, on iron supplement, continue to monitor Hgb. Stable. Recommend outpatient evaluation with colonoscopy, consideration of EGD  History of COPD and asthma: Continue home bronchodilators. Maintain O2 saturation greater than 92%.  Morbid obesity with BMI greater than 45: Complicates all cares and increases risk of poor outcome of COVID-19.   History of glaucoma: Continue home  meds.  Adrenal adenoma:  Consider evaluation for overproduction of hormones as an outpatient.  I have seen and examined myself. I have spent 34 minutes in her evaluation and care.  DVT prophylaxis: Lovenox SQ Code Status: DNR  Family Communication: Daughter [name] Disposition Plan: Patient is from home. Anticipate she may need SNF. Barrier to discharge continues to require high level of oxygen, currently on high flow nasal cannula 10 L, recommend PT evaluation prior to discharge a suspect in the setting of her deconditioning she may need skilled nursing placement.  Kemuel Buchmann, DO [facility] Direct contact: see [email or web address]  7PM-7AM contact night coverage as above 10/17/2019, 5:48 PM  LOS: 6 days

## 2019-10-17 NOTE — Progress Notes (Signed)
Sister, Sharren Bridge [phone], had called while patient sleeping. She states that she can motivate patient to agree with attempting to prone or sidelying. This RN encouraged [place] to call back this morning when patient is awake. Patient has been cooperative this shift with sidelying. Will continue to monitor.

## 2019-10-18 LAB — CBC WITH DIFFERENTIAL/PLATELET
Abs Immature Granulocytes: 0.11 10*3/uL — ABNORMAL HIGH (ref 0.00–0.07)
Basophils Absolute: 0 10*3/uL (ref 0.0–0.1)
Basophils Relative: 0 %
Eosinophils Absolute: 0 10*3/uL (ref 0.0–0.5)
Eosinophils Relative: 0 %
HCT: 30 % — ABNORMAL LOW (ref 36.0–46.0)
Hemoglobin: 8.9 g/dL — ABNORMAL LOW (ref 12.0–15.0)
Immature Granulocytes: 1 %
Lymphocytes Relative: 3 %
Lymphs Abs: 0.3 10*3/uL — ABNORMAL LOW (ref 0.7–4.0)
MCH: 20.6 pg — ABNORMAL LOW (ref 26.0–34.0)
MCHC: 29.7 g/dL — ABNORMAL LOW (ref 30.0–36.0)
MCV: 69.6 fL — ABNORMAL LOW (ref 80.0–100.0)
Monocytes Absolute: 0.6 10*3/uL (ref 0.1–1.0)
Monocytes Relative: 7 %
Neutro Abs: 8.8 10*3/uL — ABNORMAL HIGH (ref 1.7–7.7)
Neutrophils Relative %: 89 %
Platelets: UNDETERMINED 10*3/uL (ref 150–400)
RBC: 4.31 MIL/uL (ref 3.87–5.11)
RDW: 23.6 % — ABNORMAL HIGH (ref 11.5–15.5)
WBC: 9.9 10*3/uL (ref 4.0–10.5)
nRBC: 0.3 % — ABNORMAL HIGH (ref 0.0–0.2)

## 2019-10-18 LAB — GLUCOSE, CAPILLARY
Glucose-Capillary: 228 mg/dL — ABNORMAL HIGH (ref 70–99)
Glucose-Capillary: 242 mg/dL — ABNORMAL HIGH (ref 70–99)
Glucose-Capillary: 197 mg/dL — ABNORMAL HIGH (ref 70–99)
Glucose-Capillary: 217 mg/dL — ABNORMAL HIGH (ref 70–99)

## 2019-10-18 LAB — BASIC METABOLIC PANEL
Anion gap: 8 (ref 5–15)
BUN: 36 mg/dL — ABNORMAL HIGH (ref 8–23)
CO2: 27 mmol/L (ref 22–32)
Calcium: 8 mg/dL — ABNORMAL LOW (ref 8.9–10.3)
Chloride: 104 mmol/L (ref 98–111)
Creatinine, Ser: 1.07 mg/dL — ABNORMAL HIGH (ref 0.44–1.00)
GFR calc Af Amer: 54 mL/min — ABNORMAL LOW (ref >60)
GFR calc non Af Amer: 47 mL/min — ABNORMAL LOW (ref >60)
Glucose, Bld: 222 mg/dL — ABNORMAL HIGH (ref 70–99)
Potassium: 4.1 mmol/L (ref 3.5–5.1)
Sodium: 139 mmol/L (ref 135–145)

## 2019-10-18 MED ORDER — INSULIN DETEMIR 100 UNIT/ML ~~LOC~~ SOLN
12.0000 [IU] | Freq: Two times a day (BID) | SUBCUTANEOUS | Status: DC
  Administered 2019-10-18 – 2019-10-19 (×2): 12 [IU] via SUBCUTANEOUS
  Filled 2019-10-18 (×4): qty 0.12

## 2019-10-18 MED ORDER — ORAL CARE MOUTH RINSE
15.0000 mL | Freq: Two times a day (BID) | OROMUCOSAL | Status: DC
  Administered 2019-10-18 – 2019-10-24 (×12): 15 mL via OROMUCOSAL

## 2019-10-18 NOTE — Progress Notes (Signed)
PROGRESS NOTE  Debra Acosta [id number] DOB: [birth year] DOA: 10/11/2019 PCP: Lucianne Lei, MD  Brief History   86 year old with history of HTN, HLD, DM2, morbid obesity, CAD status post stent presented with shortness of breath. In the ER patient was noted to be significantly hypoxic requiring high flow nasal cannula, 60% on room air, COVID-19 screening test positive on 10/11/2019. Chest x-ray with bilateral pulmonary infiltrates. TRH asked to admit for COVID-19 viral pneumonia. Started on COVID-19 directed therapies. CTA PE negative for PE on 10/14/2019. Antibiotics started 1/30 as procalcitonin elevated (0.26) on admission.  The patient has been admitted to a telemetry bed. She is receiving IV solumedrol, and has completed a dose of remdesivir. The patient has been refusing to lie prone or lie on her side. Apparently there is a friend that she will listen to who can get her to be more compliant.   Consultants  . None  Procedures  . None  Antibiotics   Anti-infectives (From admission, onward)   Start     Dose/Rate Route Frequency Ordered Stop   10/15/19 1230  azithromycin (ZITHROMAX) 500 mg in sodium chloride 0.9 % 250 mL IVPB     500 mg 250 mL/hr over 60 Minutes Intravenous Every 24 hours 10/15/19 1143 10/17/19 1900   10/15/19 1230  cefTRIAXone (ROCEPHIN) 2 g in sodium chloride 0.9 % 100 mL IVPB     2 g 200 mL/hr over 30 Minutes Intravenous Every 24 hours 10/15/19 1143 10/22/19 1229   10/12/19 1000  remdesivir 100 mg in sodium chloride 0.9 % 100 mL IVPB     100 mg 200 mL/hr over 30 Minutes Intravenous Daily 10/11/19 1427 10/15/19 1145   10/11/19 1430  remdesivir 200 mg in sodium chloride 0.9% 250 mL IVPB     200 mg 580 mL/hr over 30 Minutes Intravenous Once 10/11/19 1427 10/11/19 1703     Subjective  The patient is lying quietly. No new complaints.   Objective   Vitals:  Vitals:   10/18/19 0937 10/18/19 1300  BP: (!) 113/47 98/71  Pulse: 73 75  Resp: 15 16    Temp:  98 F (36.7 C)  SpO2: 92% 95%   Exam:  Constitutional:  . The patient is awake, alert, and oriented x 3. No acute distress. Respiratory:  . No increased work of breathing. . Rales and rhonchi throughout. Scattered wheezes. . No tactile fremitus Cardiovascular:  . Regular rate and rhythm . No murmurs, ectopy, or gallups. . No lateral PMI. No thrills. Abdomen:  . Abdomen is soft, non-tender, non-distended . Morbidly obese. . No hernias, masses, or organomegaly . Bowel sounds are distant.  Musculoskeletal:  . No cyanosis, clubbing, or edema Skin:  . No rashes, lesions, ulcers . palpation of skin: no induration or nodules Neurologic:  . CN 2-12 intact . Sensation all 4 extremities intact Psychiatric:  . Mental status: More agreeable today.  I have personally reviewed the following:   Today's Data  . Vitals  Scheduled Meds: . albuterol  2 puff Inhalation Q6H  . vitamin C  500 mg Oral Daily  . aspirin EC  81 mg Oral Daily  . brimonidine  1 drop Both Eyes TID  . cholecalciferol  1,000 Units Oral Daily  . clopidogrel  75 mg Oral Daily  . dorzolamide  1 drop Both Eyes TID  . enoxaparin (LOVENOX) injection  65 mg Subcutaneous Q24H  . ferrous sulfate  325 mg Oral TID WC  . insulin aspart  0-15 Units Subcutaneous  TID WC  . insulin detemir  8 Units Subcutaneous BID  . ipratropium  2 puff Inhalation Q6H  . latanoprost  1 drop Both Eyes QHS  . linagliptin  5 mg Oral Daily  . mouth rinse  15 mL Mouth Rinse BID  . methylPREDNISolone (SOLU-MEDROL) injection  40 mg Intravenous Q8H  . mometasone-formoterol  2 puff Inhalation BID  . montelukast  10 mg Oral QHS  . sodium chloride flush  3 mL Intravenous Q12H  . zinc sulfate  220 mg Oral Daily   Continuous Infusions: . sodium chloride    . cefTRIAXone (ROCEPHIN)  IV 2 g (10/18/19 1149)    Principal Problem:   Acute hypoxemic respiratory failure due to COVID-19 [facility]) Active Problems:   Morbid obesity ([place])   DM2  (diabetes mellitus, type 2) ([place])   Dyslipidemia   Hypertension   Diabetes mellitus without complication ([place])   Coronary artery disease   Iron deficiency anemia due to chronic blood loss   LOS: 7 days   A & P  Acute hypoxic respiratory failure down to 60% on room air secondary to COVID-19 viral pneumonia with concern for superimposed bacterial pneumonia given elevated procalcitonin: The patient has completed Remdesivir and Actemra. She continues to receive IV solumedrol. She is refusing to pronate or lie on her side and to wear O2. However, her oxygen saturations dropped to 60% on room air. She continues to require 8L by HFNC. Continue incentive spirometer and flutter valve and Lasix. She is receiving Tussionex, and Vitamin C &Zinc, vitamin D3. As her procalcitonin is elevated she is now getting Azithromycin and ceftriaxone for possible superinfection. MRSA by PCR is negative. Urine antigen for strep pneumo is negative. Inflammatory markers are trending down. Continue Tussionex twice daily x3 days. Monitor.  Essential hypertension: Blood pressures are actually low without antihypertensives. Will give a bolus today.   Diabetes mellitus type 2 with hyperglycemia: The patient's glucoses are elevated due to IV Steroids. She is now receiving Levemir 8 units bid with SSI and linagliptin. Appreciate diabetes coordinator assistance. Hemoglobin A1c 6.6 on 10/11/2019. FSBS has run 194-293 for the last 24 hours. I have increased levemir to 12 unts bid. Montiro.   Coronary artery disease: Noted and stable. Pt is being monitored on telemetry. No chest pain. Continue ASA and Plavix.  Iron deficiency anemia: Ferritin is low, on iron supplement, continue to monitor Hgb. Stable. Recommend outpatient evaluation with colonoscopy, consideration of EGD  History of COPD and asthma: Continue home bronchodilators. Maintain O2 saturation greater than 92%.  Morbid obesity with BMI greater than 45: Complicates  all cares and increases risk of poor outcome of COVID-19.   History of glaucoma: Continue home meds.  Adrenal adenoma:  Consider evaluation for overproduction of hormones as an outpatient.  I have seen and examined myself. I have spent 35 minutes in her evaluation and care.  DVT prophylaxis: Lovenox SQ Code Status: DNR  Family Communication: Daughter [name] Disposition Plan: Patient is from home. Anticipate she may need SNF. Barrier to discharge continues to require high level of oxygen, currently on high flow nasal cannula 10 L, recommend PT evaluation prior to discharge a suspect in the setting of her deconditioning she may need skilled nursing placement.  Vasco Chong, DO [facility] Direct contact: see [email or web address]  7PM-7AM contact night coverage as above 10/18/2019, 4:15 PM  LOS: 6 days

## 2019-10-18 NOTE — Progress Notes (Addendum)
Inpatient Diabetes Program Recommendations  AACE/ADA: New Consensus Statement on Inpatient Glycemic Control (2015)  Target Ranges:  Prepandial:   less than 140 mg/dL      Peak postprandial:   less than 180 mg/dL (1-2 hours)      Critically ill patients:  140 - 180 mg/dL   Results for Debra Acosta, Debra Acosta (MRN [id number]) as of 10/18/2019 10:50  Ref. Range 10/17/2019 08:13 10/17/2019 11:59 10/17/2019 16:29 10/17/2019 20:00  Glucose-Capillary Latest Ref Range: 70 - 99 mg/dL 217 (H)  5 units NOVOLOG  254 (H)  8 units NOVOLOG +  8 units LEVEMIR 199 (H)  3 units NOVOLOG  293 (H)     8 units LEVEMIR   Results for Debra Acosta, Debra Acosta (MRN [id number]) as of 10/18/2019 10:50  Ref. Range 10/18/2019 07:51  Glucose-Capillary Latest Ref Range: 70 - 99 mg/dL 228 (H)  5 units NOVOLOG    Results for Debra Acosta, Debra Acosta (MRN [id number]) as of 10/18/2019 10:50  Ref. Range 10/11/2019 12:00  Hemoglobin A1C Latest Ref Range: 4.8 - 5.6 % 6.6 (H)     Home DM Meds: Metformin 1000 mg QHS  Current Orders: Levemir 8 units BID      Novolog Moderate Correction Scale/ SSI (0-15 units) TID AC      Tradjenta 5 mg Daily     Well controlled at home prior to admission on Metformin alone--Likely elevated due to illness and Steroids    MD- Note patient getting Solumedrol 40 mg Q8 hours.  CBGs remain >200  Please consider the following while patient remains on IV Steroids:  1. Increase Levemir slightly to 10 units BID  2. Start Novolog Meal Coverage: Novolog 4 units TID with meals  (Please add the following Hold Parameters: Hold if pt eats <50% of meal, Hold if pt NPO)     --Will follow patient during hospitalization--  Wyn Quaker RN, MSN, CDE Diabetes Coordinator Inpatient Glycemic Control Team Team Pager: [phone] (8a-5p)

## 2019-10-18 NOTE — Progress Notes (Signed)
Called and updated patient's daughter Debra Acosta on her mother. All questions answered.

## 2019-10-19 LAB — GLUCOSE, CAPILLARY
Glucose-Capillary: 232 mg/dL — ABNORMAL HIGH (ref 70–99)
Glucose-Capillary: 234 mg/dL — ABNORMAL HIGH (ref 70–99)
Glucose-Capillary: 277 mg/dL — ABNORMAL HIGH (ref 70–99)
Glucose-Capillary: 286 mg/dL — ABNORMAL HIGH (ref 70–99)
Glucose-Capillary: 221 mg/dL — ABNORMAL HIGH (ref 70–99)
Glucose-Capillary: 264 mg/dL — ABNORMAL HIGH (ref 70–99)

## 2019-10-19 MED ORDER — INSULIN DETEMIR 100 UNIT/ML ~~LOC~~ SOLN
15.0000 [IU] | Freq: Two times a day (BID) | SUBCUTANEOUS | Status: DC
  Administered 2019-10-19 – 2019-10-24 (×11): 15 [IU] via SUBCUTANEOUS
  Filled 2019-10-19 (×13): qty 0.15

## 2019-10-19 NOTE — Plan of Care (Signed)
  Problem: Education: Goal: Knowledge of risk factors and measures for prevention of condition will improve Outcome: Progressing   Problem: Coping: Goal: Psychosocial and spiritual needs will be supported Outcome: Progressing   Problem: Respiratory: Goal: Will maintain a patent airway Outcome: Progressing Goal: Complications related to the disease process, condition or treatment will be avoided or minimized Outcome: Progressing   Problem: Education: Goal: Knowledge of General Education information will improve Description: Including pain rating scale, medication(s)/side effects and non-pharmacologic comfort measures Outcome: Progressing   Problem: Health Behavior/Discharge Planning: Goal: Ability to manage health-related needs will improve Outcome: Progressing   Problem: Clinical Measurements: Goal: Ability to maintain clinical measurements within normal limits will improve Outcome: Progressing Goal: Will remain free from infection Outcome: Progressing Goal: Diagnostic test results will improve Outcome: Progressing Goal: Respiratory complications will improve Outcome: Progressing Goal: Cardiovascular complication will be avoided Outcome: Progressing   Problem: Activity: Goal: Risk for activity intolerance will decrease Outcome: Progressing   Problem: Coping: Goal: Level of anxiety will decrease Outcome: Progressing   

## 2019-10-19 NOTE — Progress Notes (Signed)
PROGRESS NOTE  Debra Acosta [id number] DOB: September 06, 1931 DOA: 10/11/2019 PCP: Lucianne Lei, MD  Brief History   86 year old with history of HTN, HLD, DM2, morbid obesity, CAD status post stent presented with shortness of breath. In the ER patient was noted to be significantly hypoxic requiring high flow nasal cannula, 60% on room air, COVID-19 screening test positive on 10/11/2019. Chest x-ray with bilateral pulmonary infiltrates. TRH asked to admit for COVID-19 viral pneumonia. Started on COVID-19 directed therapies. CTA PE negative for PE on 10/14/2019. Antibiotics started 1/30 as procalcitonin elevated (0.26) on admission.  The patient has been admitted to a telemetry bed. She is receiving IV solumedrol, and has completed a dose of remdesivir. The patient's mental status and compliance with care is greatly improved. She continues to have high oxygen requirements.  Consultants  . None  Procedures  . None  Antibiotics   Anti-infectives (From admission, onward)   Start     Dose/Rate Route Frequency Ordered Stop   10/15/19 1230  azithromycin (ZITHROMAX) 500 mg in sodium chloride 0.9 % 250 mL IVPB     500 mg 250 mL/hr over 60 Minutes Intravenous Every 24 hours 10/15/19 1143 10/17/19 1900   10/15/19 1230  cefTRIAXone (ROCEPHIN) 2 g in sodium chloride 0.9 % 100 mL IVPB     2 g 200 mL/hr over 30 Minutes Intravenous Every 24 hours 10/15/19 1143 10/22/19 1229   10/12/19 1000  remdesivir 100 mg in sodium chloride 0.9 % 100 mL IVPB     100 mg 200 mL/hr over 30 Minutes Intravenous Daily 10/11/19 1427 10/15/19 1145   10/11/19 1430  remdesivir 200 mg in sodium chloride 0.9% 250 mL IVPB     200 mg 580 mL/hr over 30 Minutes Intravenous Once 10/11/19 1427 10/11/19 1703     Subjective  The patient is lying quietly. No new complaints.   Objective   Vitals:  Vitals:   10/19/19 0808 10/19/19 1245  BP: (!) 104/41 (!) 127/44  Pulse:  73  Resp:  18  Temp:  97.6 F (36.4 C)  SpO2:   93%   Exam:  Constitutional:  . The patient is awake, alert, and oriented x 3. No acute distress. Respiratory:  . No increased work of breathing. . Rales and rhonchi auscultated. Scattered wheezes. . No tactile fremitus Cardiovascular:  . Regular rate and rhythm . No murmurs, ectopy, or gallups. . No lateral PMI. No thrills. Abdomen:  . Abdomen is soft, non-tender, non-distended . Morbidly obese. . No hernias, masses, or organomegaly . Bowel sounds are distant.  Musculoskeletal:  . No cyanosis, clubbing, or edema Skin:  . No rashes, lesions, ulcers . palpation of skin: no induration or nodules Neurologic:  . CN 2-12 intact . Sensation all 4 extremities intact Psychiatric:  . Mental status: Improved.  I have personally reviewed the following:   Today's Data  . Vitals  Scheduled Meds: . albuterol  2 puff Inhalation Q6H  . vitamin C  500 mg Oral Daily  . aspirin EC  81 mg Oral Daily  . brimonidine  1 drop Both Eyes TID  . cholecalciferol  1,000 Units Oral Daily  . clopidogrel  75 mg Oral Daily  . dorzolamide  1 drop Both Eyes TID  . enoxaparin (LOVENOX) injection  65 mg Subcutaneous Q24H  . ferrous sulfate  325 mg Oral TID WC  . insulin aspart  0-15 Units Subcutaneous TID WC  . insulin detemir  12 Units Subcutaneous BID  . ipratropium  2  puff Inhalation Q6H  . latanoprost  1 drop Both Eyes QHS  . linagliptin  5 mg Oral Daily  . mouth rinse  15 mL Mouth Rinse BID  . methylPREDNISolone (SOLU-MEDROL) injection  40 mg Intravenous Q8H  . mometasone-formoterol  2 puff Inhalation BID  . montelukast  10 mg Oral QHS  . sodium chloride flush  3 mL Intravenous Q12H  . zinc sulfate  220 mg Oral Daily   Continuous Infusions: . sodium chloride    . cefTRIAXone (ROCEPHIN)  IV 2 g (10/19/19 1244)    Principal Problem:   Acute hypoxemic respiratory failure due to COVID-19 [facility]) Active Problems:   Morbid obesity ([place])   DM2 (diabetes mellitus, type 2) ([place])    Dyslipidemia   Hypertension   Diabetes mellitus without complication ([place])   Coronary artery disease   Iron deficiency anemia due to chronic blood loss   LOS: 8 days   A & P  Acute hypoxic respiratory failure down to 60% on room air secondary to COVID-19 viral pneumonia with concern for superimposed bacterial pneumonia given elevated procalcitonin: The patient has completed Remdesivir and Actemra. She continues to receive IV solumedrol and will continue on it for 2 more days. However, her oxygen saturations dropped to 60% on room air. She continues to require 5L by HFNC. Continue incentive spirometer and flutter valve and Lasix. She is receiving Tussionex, and Vitamin C &Zinc, vitamin D3. As her procalcitonin is elevated she is now getting Azithromycin and ceftriaxone for possible superinfection. MRSA by PCR is negative. Urine antigen for strep pneumo is negative. Inflammatory markers are trending down. Continue Tussionex twice daily x3 days. Monitor.  Essential hypertension: Blood pressures are more normotensive than low, but without antihypertensives after bolus. Monitor.  Diabetes mellitus type 2 with hyperglycemia: The patient's glucoses are elevated due to IV Steroids. She is now receiving Levemir 12 units bid with SSI and linagliptin. Appreciate diabetes coordinator assistance. Hemoglobin A1c 6.6 on 10/11/2019. FSBS has run 217-286 for the last 24 hours. I have increased levemir to 15 unts bid. Montior.   Coronary artery disease: Noted and stable. Pt is being monitored on telemetry. No chest pain. Continue ASA and Plavix.  Iron deficiency anemia: Ferritin is low, on iron supplement, continue to monitor Hgb. Stable. Recommend outpatient evaluation with colonoscopy, consideration of EGD  History of COPD and asthma: Continue home bronchodilators. Maintain O2 saturation greater than 92%.  Morbid obesity with BMI greater than 45: Complicates all cares and increases risk of poor outcome  of COVID-19.   History of glaucoma: Continue home meds.  Adrenal adenoma:  Consider evaluation for overproduction of hormones as an outpatient.  I have seen and examined myself. I have spent 32 minutes in her evaluation and care.  DVT prophylaxis: Lovenox SQ Code Status: DNR  Family Communication: Daughter [name] Disposition Plan: Patient is from home. Anticipate she may need SNF. Barrier to discharge continues to require high level of oxygen, currently on high flow nasal cannula 10 L. PT has been consulted to make recommendations for disposition. She is still requiring 5L HFNC in order to maintain oxygen saturations in the 90's. Yazmine Sorey, DO [facility] Direct contact: see [email or web address]  7PM-7AM contact night coverage as above 10/19/2019, 2:27 PM  LOS: 6 days

## 2019-10-20 LAB — CBC WITH DIFFERENTIAL/PLATELET
Abs Immature Granulocytes: 0.19 10*3/uL — ABNORMAL HIGH (ref 0.00–0.07)
Basophils Absolute: 0 10*3/uL (ref 0.0–0.1)
Basophils Relative: 0 %
Eosinophils Absolute: 0 10*3/uL (ref 0.0–0.5)
Eosinophils Relative: 0 %
HCT: 30.5 % — ABNORMAL LOW (ref 36.0–46.0)
Hemoglobin: 8.9 g/dL — ABNORMAL LOW (ref 12.0–15.0)
Immature Granulocytes: 2 %
Lymphocytes Relative: 3 %
Lymphs Abs: 0.3 10*3/uL — ABNORMAL LOW (ref 0.7–4.0)
MCH: 20.6 pg — ABNORMAL LOW (ref 26.0–34.0)
MCHC: 29.2 g/dL — ABNORMAL LOW (ref 30.0–36.0)
MCV: 70.6 fL — ABNORMAL LOW (ref 80.0–100.0)
Monocytes Absolute: 0.5 10*3/uL (ref 0.1–1.0)
Monocytes Relative: 5 %
Neutro Abs: 8.6 10*3/uL — ABNORMAL HIGH (ref 1.7–7.7)
Neutrophils Relative %: 90 %
Platelets: 267 10*3/uL (ref 150–400)
RBC: 4.32 MIL/uL (ref 3.87–5.11)
RDW: 24.2 % — ABNORMAL HIGH (ref 11.5–15.5)
WBC: 9.6 10*3/uL (ref 4.0–10.5)
nRBC: 0 % (ref 0.0–0.2)

## 2019-10-20 LAB — BASIC METABOLIC PANEL
Anion gap: 10 (ref 5–15)
BUN: 42 mg/dL — ABNORMAL HIGH (ref 8–23)
CO2: 26 mmol/L (ref 22–32)
Calcium: 8.2 mg/dL — ABNORMAL LOW (ref 8.9–10.3)
Chloride: 101 mmol/L (ref 98–111)
Creatinine, Ser: 1.01 mg/dL — ABNORMAL HIGH (ref 0.44–1.00)
GFR calc Af Amer: 58 mL/min — ABNORMAL LOW (ref >60)
GFR calc non Af Amer: 50 mL/min — ABNORMAL LOW (ref >60)
Glucose, Bld: 226 mg/dL — ABNORMAL HIGH (ref 70–99)
Potassium: 4.6 mmol/L (ref 3.5–5.1)
Sodium: 137 mmol/L (ref 135–145)

## 2019-10-20 LAB — GLUCOSE, CAPILLARY
Glucose-Capillary: 222 mg/dL — ABNORMAL HIGH (ref 70–99)
Glucose-Capillary: 238 mg/dL — ABNORMAL HIGH (ref 70–99)
Glucose-Capillary: 222 mg/dL — ABNORMAL HIGH (ref 70–99)
Glucose-Capillary: 213 mg/dL — ABNORMAL HIGH (ref 70–99)

## 2019-10-20 NOTE — Evaluation (Signed)
Occupational Therapy Evaluation Patient Details Name: Debra Acosta MRN: [id number] DOB: [birth year] Today's Date: 10/20/2019    History of Present Illness 86 yo female presenting with ED with significant hypoxic requiring high flow nasal cannula, 60% on room air. Tested COVID-19 positive on 10/11/19. Chest x-ray with bilateral pulmonary infiltrates.   CTA PE negative for PE on 10/14/2019. PMH including  HTN, HLD, DM2, morbid obesity, CAD status post stent.   Clinical Impression   PTA, pt was living with her son and was independent with BADLs and use SPC for mobility. Pt currently requiring Min A for UB ADLs, Min-Max A for LB ADLs, and Min Guard-Min A for functional mobility with RW. Pt presenting with decreased activity tolerance as seen by fatigue, SOB, and decreased SpO2. During mobility, SpO2 dropping to low 70s. Requiring 3L O2 and significant seated rest break for recovery >88%. Pt also with poor awareness of deficits and declining any difficulty when she required seated rest break due to significant fatigue. Pt would benefit from further acute OT to facilitate safe dc. Recommend dc to SNF for further OT to optimize safety, independence with ADLs, and return to PLOF.      Follow Up Recommendations  SNF;Supervision/Assistance - 24 hour(Pt declining SNF and will need HH)    Equipment Recommendations  3 in 1 bedside commode(bari)    Recommendations for Other Services PT consult     Precautions / Restrictions Precautions Precautions: Fall Restrictions Weight Bearing Restrictions: No      Mobility Bed Mobility               General bed mobility comments: In recliner upon arrival  Transfers Overall transfer level: Needs assistance Equipment used: Rolling walker (2 wheeled) Transfers: Sit to/from Stand Sit to Stand: Min guard;+2 safety/equipment         General transfer comment: Min Guard A for safety. Cues for hand placement    Balance Overall balance assessment:  Needs assistance Sitting-balance support: No upper extremity supported;Feet supported Sitting balance-Leahy Scale: Fair     Standing balance support: Bilateral upper extremity supported;During functional activity Standing balance-Leahy Scale: Poor Standing balance comment: Reliant on UE support                           ADL either performed or assessed with clinical judgement   ADL Overall ADL's : Needs assistance/impaired Eating/Feeding: Set up;Sitting   Grooming: Min guard;Wash/dry face;Sitting   Upper Body Bathing: Minimal assistance;Sitting   Lower Body Bathing: Minimal assistance;Sit to/from stand   Upper Body Dressing : Minimal assistance;Sitting   Lower Body Dressing: Maximal assistance;Sit to/from stand Lower Body Dressing Details (indicate cue type and reason): Max A for managing socks Toilet Transfer: Ambulation;RW;BSC;Min guard;+2 for safety/equipment Toilet Transfer Details (indicate cue type and reason): Min Guard A for safety and cues for hand placement. Toileting- Clothing Manipulation and Hygiene: Minimal assistance;Sit to/from stand Toileting - Clothing Manipulation Details (indicate cue type and reason): Min A for standing balance while pt performed peri care     Functional mobility during ADLs: Minimal assistance;Rolling walker;+2 for safety/equipment General ADL Comments: Pt presenting with poor activity tolerance and fatigues quickly. Poor awarenes of deficits.      Vision         Perception     Praxis      Pertinent Vitals/Pain Pain Assessment: Faces Faces Pain Scale: Hurts a little bit Pain Location: Generalized Pain Descriptors / Indicators: Constant;Discomfort Pain Intervention(s): Monitored during  session;Repositioned     Hand Dominance     Extremity/Trunk Assessment Upper Extremity Assessment Upper Extremity Assessment: Generalized weakness   Lower Extremity Assessment Lower Extremity Assessment: Generalized weakness    Cervical / Trunk Assessment Cervical / Trunk Assessment: Other exceptions;Kyphotic Cervical / Trunk Exceptions: Increased body habitus   Communication Communication Communication: No difficulties   Cognition Arousal/Alertness: Awake/alert Behavior During Therapy: Flat affect Overall Cognitive Status: Impaired/Different from baseline Area of Impairment: Following commands;Awareness;Problem solving;Safety/judgement;Attention                   Current Attention Level: Sustained   Following Commands: Follows one step commands with increased time;Follows multi-step commands inconsistently Safety/Judgement: Decreased awareness of safety;Decreased awareness of deficits Awareness: Intellectual;Emergent Problem Solving: Slow processing;Difficulty sequencing;Requires verbal cues General Comments: Pt presenting with poor attention as seen during conversation as pt would start different topics. Pt also with poor awareness of deficits and limitations.    General Comments  On 2L O2 via [place] on entry, placed on RA dropped to 87%O2, 2L supplemental O2 returned and SaO2 rebounded. During mobility, SpO2 dropping to low 70s. Requiring 3L O2 and significant seated rest break for recovery >88%    Exercises     Shoulder Instructions      Home Living Family/patient expects to be discharged to:: Private residence Living Arrangements: Children Available Help at Discharge: Available PRN/intermittently;Family(Son works during the day) Type of Home: [place]: One level     Bathroom Shower/Tub: Occupational psychologist: Handicapped height Bathroom Accessibility: Yes   Home Equipment: [place] - single point;Shower seat   Additional Comments: Pt very vague and with poor attention during home set up questions.       Prior Functioning/Environment Level of Independence: Needs assistance  Gait / Transfers Assistance Needed: uses cane  ADL's / Homemaking Assistance Needed:  family assists with cooking/cleaning, independent with bathing and dressing             OT Problem List: Decreased strength;Decreased range of motion;Decreased activity tolerance;Impaired balance (sitting and/or standing);Decreased knowledge of use of DME or AE;Decreased knowledge of precautions;Impaired UE functional use      OT Treatment/Interventions: Self-care/ADL training;Therapeutic exercise;Energy conservation;DME and/or AE instruction;Therapeutic activities;Patient/family education    OT Goals(Current goals can be found in the care plan section) Acute Rehab OT Goals Patient Stated Goal: go home OT Goal Formulation: With patient Time For Goal Achievement: 11/03/19 Potential to Achieve Goals: Good ADL Goals Pt Will Perform Grooming: standing;with min guard assist Pt Will Perform Lower Body Dressing: with min guard assist;with adaptive equipment;sit to/from stand Pt Will Transfer to Toilet: with min guard assist;bedside commode;ambulating Pt Will Perform Toileting - Clothing Manipulation and hygiene: with min guard assist;sit to/from stand;sitting/lateral leans Additional ADL Goal #1: Pt will verbalize energy conservation technqiues for ADLs with Min cues  OT Frequency: Min 2X/week   Barriers to D/C:            Co-evaluation PT/OT/SLP Co-Evaluation/Treatment: Yes Reason for Co-Treatment: For patient/therapist safety;To address functional/ADL transfers   OT goals addressed during session: ADL's and self-care      AM-PAC OT "6 Clicks" Daily Activity     Outcome Measure Help from another person eating meals?: None Help from another person taking care of personal grooming?: A Little Help from another person toileting, which includes using toliet, bedpan, or urinal?: A Little Help from another person bathing (including washing, rinsing, drying)?: A Little Help from another person  to put on and taking off regular upper body clothing?: A Little Help from another person to  put on and taking off regular lower body clothing?: A Lot 6 Click Score: 18   End of Session Equipment Utilized During Treatment: Gait belt;Rolling walker;Oxygen Nurse Communication: Mobility status  Activity Tolerance: Patient tolerated treatment well Patient left: in chair;with call bell/phone within reach;with chair alarm set  OT Visit Diagnosis: Unsteadiness on feet (R26.81);Other abnormalities of gait and mobility (R26.89);Muscle weakness (generalized) (M62.81);Other symptoms and signs involving cognitive function                Time: 1032-1105 OT Time Calculation (min): 33 min Charges:  OT General Charges $OT Visit: 1 Visit OT Evaluation $OT Eval Moderate Complexity: [place], OTR/L Acute Rehab Pager: [phone] Office: [place] 10/20/2019, 5:17 PM

## 2019-10-20 NOTE — Progress Notes (Signed)
Inpatient Diabetes Program Recommendations  AACE/ADA: New Consensus Statement on Inpatient Glycemic Control (2015)  Target Ranges:  Prepandial:   less than 140 mg/dL      Peak postprandial:   less than 180 mg/dL (1-2 hours)      Critically ill patients:  140 - 180 mg/dL   Lab Results  Component Value Date   GLUCAP 238 (H) 10/20/2019   HGBA1C 6.6 (H) 10/11/2019    Review of Glycemic Control Results for Debra Acosta, Debra Acosta (MRN [id number]) as of 10/20/2019 12:24  Ref. Range 10/19/2019 16:48 10/19/2019 21:53 10/20/2019 08:28 10/20/2019 12:14  Glucose-Capillary Latest Ref Range: 70 - 99 mg/dL 221 (H) 264 (H) 222 (H) 238 (H)   Home DM Meds: Metformin 1000 mg QHS  Current Orders: Levemir 15 units BID                            Novolog Moderate Correction Scale/ SSI (0-15 units) TID AC                            Tradjenta 5 mg Daily  Consider further increasing Levemir to 18 units BID.   Thanks, Bronson Curb, MSN, RNC-OB Diabetes Coordinator [phone] (8a-5p)

## 2019-10-20 NOTE — Progress Notes (Signed)
PROGRESS NOTE  Debra Acosta [id number] DOB: May 21, 1931 DOA: 10/11/2019 PCP: Lucianne Lei, MD  Brief History   86 year old with history of HTN, HLD, DM2, morbid obesity, CAD status post stent presented with shortness of breath. In the ER patient was noted to be significantly hypoxic requiring high flow nasal cannula, 60% on room air, COVID-19 screening test positive on 10/11/2019. Chest x-ray with bilateral pulmonary infiltrates. TRH asked to admit for COVID-19 viral pneumonia. Started on COVID-19 directed therapies. CTA PE negative for PE on 10/14/2019. Antibiotics started 1/30 as procalcitonin elevated (0.26) on admission.  The patient has been admitted to a telemetry bed. She is receiving IV solumedrol, and has completed a dose of remdesivir. The patient's mental status and compliance with care is greatly improved. She continues to require 4L by HFNC.  Consultants  . None  Procedures  . None  Antibiotics   Anti-infectives (From admission, onward)   Start     Dose/Rate Route Frequency Ordered Stop   10/15/19 1230  azithromycin (ZITHROMAX) 500 mg in sodium chloride 0.9 % 250 mL IVPB     500 mg 250 mL/hr over 60 Minutes Intravenous Every 24 hours 10/15/19 1143 10/17/19 1900   10/15/19 1230  cefTRIAXone (ROCEPHIN) 2 g in sodium chloride 0.9 % 100 mL IVPB     2 g 200 mL/hr over 30 Minutes Intravenous Every 24 hours 10/15/19 1143 10/22/19 1229   10/12/19 1000  remdesivir 100 mg in sodium chloride 0.9 % 100 mL IVPB     100 mg 200 mL/hr over 30 Minutes Intravenous Daily 10/11/19 1427 10/15/19 1145   10/11/19 1430  remdesivir 200 mg in sodium chloride 0.9% 250 mL IVPB     200 mg 580 mL/hr over 30 Minutes Intravenous Once 10/11/19 1427 10/11/19 1703     Subjective  The patient is lying quietly. No new complaints.   Objective   Vitals:  Vitals:   10/20/19 0802 10/20/19 1323  BP: 120/61 (!) 121/58  Pulse: 72 70  Resp: 17 (!) 21  Temp:  97.8 F (36.6 C)  SpO2: 91% 92%    Exam:  Constitutional:  . The patient is awake, alert, and oriented x 3. No acute distress. Respiratory:  . No increased work of breathing. . Rales and rhonchi auscultated. Scattered wheezes. . No tactile fremitus Cardiovascular:  . Regular rate and rhythm . No murmurs, ectopy, or gallups. . No lateral PMI. No thrills. Abdomen:  . Abdomen is soft, non-tender, non-distended . Morbidly obese. . No hernias, masses, or organomegaly . Bowel sounds are distant.  Musculoskeletal:  . No cyanosis, clubbing, or edema Skin:  . No rashes, lesions, ulcers . palpation of skin: no induration or nodules Neurologic:  . CN 2-12 intact . Sensation all 4 extremities intact Psychiatric:  . Mental status: Improved.  I have personally reviewed the following:   Today's Data  . Vitals  Scheduled Meds: . albuterol  2 puff Inhalation Q6H  . vitamin C  500 mg Oral Daily  . aspirin EC  81 mg Oral Daily  . brimonidine  1 drop Both Eyes TID  . cholecalciferol  1,000 Units Oral Daily  . clopidogrel  75 mg Oral Daily  . dorzolamide  1 drop Both Eyes TID  . enoxaparin (LOVENOX) injection  65 mg Subcutaneous Q24H  . ferrous sulfate  325 mg Oral TID WC  . insulin aspart  0-15 Units Subcutaneous TID WC  . insulin detemir  15 Units Subcutaneous BID  . ipratropium  2  puff Inhalation Q6H  . latanoprost  1 drop Both Eyes QHS  . linagliptin  5 mg Oral Daily  . mouth rinse  15 mL Mouth Rinse BID  . methylPREDNISolone (SOLU-MEDROL) injection  40 mg Intravenous Q8H  . mometasone-formoterol  2 puff Inhalation BID  . montelukast  10 mg Oral QHS  . sodium chloride flush  3 mL Intravenous Q12H  . zinc sulfate  220 mg Oral Daily   Continuous Infusions: . sodium chloride    . cefTRIAXone (ROCEPHIN)  IV 2 g (10/20/19 1251)    Principal Problem:   Acute hypoxemic respiratory failure due to COVID-19 [facility]) Active Problems:   Morbid obesity ([place])   DM2 (diabetes mellitus, type 2) ([place])   Dyslipidemia    Hypertension   Diabetes mellitus without complication ([place])   Coronary artery disease   Iron deficiency anemia due to chronic blood loss   LOS: 9 days   A & P  Acute hypoxic respiratory failure down to 60% on room air secondary to COVID-19 viral pneumonia with concern for superimposed bacterial pneumonia given elevated procalcitonin: The patient has completed Remdesivir and Actemra. She continues to receive IV solumedrol and will continue on it for 2 more days. However, her oxygen saturations dropped to 60% on room air. She continues to require 4L by HFNC to maintain saturations in the low nineties at rest. Continue incentive spirometer and flutter valve and Lasix. She is receiving Tussionex, and Vitamin C &Zinc, vitamin D3. As her procalcitonin is elevated she is now getting Azithromycin and ceftriaxone for possible superinfection. MRSA by PCR is negative. Urine antigen for strep pneumo is negative. Inflammatory markers are trending down. Continue Tussionex twice daily x3 days. Monitor.  Essential hypertension: Blood pressures are more normotensive than low, but without antihypertensives after bolus. Monitor.  Diabetes mellitus type 2 with hyperglycemia: The patient's glucoses are elevated due to IV Steroids. She is now receiving Levemir 12 units bid with SSI and linagliptin. Appreciate diabetes coordinator assistance. Hemoglobin A1c 6.6 on 10/11/2019. FSBS has run 217-286 for the last 24 hours. I have increased levemir to 15 unts bid. [place].   Coronary artery disease: Noted and stable. Pt is being monitored on telemetry. No chest pain. Continue ASA and Plavix.  Iron deficiency anemia: Ferritin is low, on iron supplement, continue to monitor Hgb. Stable. Recommend outpatient evaluation with colonoscopy, consideration of EGD  History of COPD and asthma: Continue home bronchodilators. Maintain O2 saturation greater than 92%.  Morbid obesity with BMI greater than 45: Complicates all cares  and increases risk of poor outcome of COVID-19.   History of glaucoma: Continue home meds.  Adrenal adenoma:  Consider evaluation for overproduction of hormones as an outpatient.  I have seen and examined myself. I have spent 34 minutes in her evaluation and care.  DVT prophylaxis: Lovenox SQ Code Status: DNR  Family Communication: None available.  Disposition Plan: Patient is from home. Anticipate she may need SNF. Barrier to discharge continues to require high level of oxygen, currently on high flow nasal cannula 10 L. PT has been consulted to make recommendations for disposition. She is still requiring 4L HFNC in order to maintain oxygen saturations in the 90's. Larnie Heart, DO [facility] Direct contact: see [email or web address]  7PM-7AM contact night coverage as above 10/20/2019, 3:58 PM  LOS: 6 days

## 2019-10-20 NOTE — Evaluation (Signed)
Physical Therapy Evaluation Patient Details Name: Debra Acosta MRN: [id number] DOB: Jan 19, 1931 Today's Date: 10/20/2019   History of Present Illness  85 yo female presenting with ED with significant hypoxic requiring high flow nasal cannula, 60% on room air. Tested COVID-19 positive on 10/11/19. Chest x-ray with bilateral pulmonary infiltrates.   CTA PE negative for PE on 10/14/2019. PMH including  HTN, HLD, DM2, morbid obesity, CAD status post stent.  Clinical Impression  PTA pt living with son in single story home. Pt reports independence ambulating with a cane, and that she is able to bath and dress but that her son brings food home. Pt is currently limited in safe mobility by oxygen desaturation (see General Comments) in presence of generalized weakness and decreased balance. PT recommending SNF level rehab at discharge to improve strength and balance prior to going home. PT will continue to follow acutely.     Follow Up Recommendations SNF    Equipment Recommendations  None recommended by PT    Recommendations for Other Services       Precautions / Restrictions Precautions Precautions: Fall Restrictions Weight Bearing Restrictions: No      Mobility  Bed Mobility               General bed mobility comments: In recliner upon arrival  Transfers Overall transfer level: Needs assistance Equipment used: Rolling walker (2 wheeled) Transfers: Sit to/from Stand Sit to Stand: Min guard;+2 safety/equipment         General transfer comment: Min Guard A for safety. Cues for hand placement  Ambulation/Gait Ambulation/Gait assistance: Min assist;+2 safety/equipment Gait Distance (Feet): 15 Feet Assistive device: Rolling walker (2 wheeled) Gait Pattern/deviations: Step-to pattern;Decreased step length - right;Decreased step length - left;Shuffle;Wide base of support     General Gait Details: minA for steadying with slow, mildly unsteady shuffling gait, no overt LoB,  after 15 feet requested to sit due to SoB         Balance Overall balance assessment: Needs assistance Sitting-balance support: No upper extremity supported;Feet supported Sitting balance-Leahy Scale: Fair     Standing balance support: Bilateral upper extremity supported;During functional activity Standing balance-Leahy Scale: Poor Standing balance comment: Reliant on UE support                             Pertinent Vitals/Pain Pain Assessment: Faces Faces Pain Scale: Hurts a little bit    Home Living Family/patient expects to be discharged to:: Private residence Living Arrangements: Children Available Help at Discharge: Available PRN/intermittently(works ) Type of Home: House       Home Layout: One level Home Equipment: Cane - single point;Shower seat Additional Comments: pt irritated that home setting     Prior Function Level of Independence: Needs assistance   Gait / Transfers Assistance Needed: uses cane   ADL's / Homemaking Assistance Needed: family assists with cooking/cleaning, independent with bathing and dressing         Hand Dominance        Extremity/Trunk Assessment   Upper Extremity Assessment Upper Extremity Assessment: Generalized weakness    Lower Extremity Assessment Lower Extremity Assessment: Generalized weakness    Cervical / Trunk Assessment Cervical / Trunk Assessment: Other exceptions;Kyphotic Cervical / Trunk Exceptions: Increased body habitus  Communication   Communication: No difficulties  Cognition Arousal/Alertness: Awake/alert Behavior During Therapy: Flat affect Overall Cognitive Status: Impaired/Different from baseline Area of Impairment: Following commands;Awareness;Problem solving;Safety/judgement;Attention  Current Attention Level: Sustained   Following Commands: Follows one step commands with increased time;Follows multi-step commands inconsistently Safety/Judgement: Decreased  awareness of safety;Decreased awareness of deficits Awareness: Intellectual;Emergent Problem Solving: Slow processing;Difficulty sequencing;Requires verbal cues General Comments: Pt presenting with poor attention as seen during conversation as pt would start different topics. Pt also with poor awareness of deficits and limitations.       General Comments General comments (skin integrity, edema, etc.): On 2L O2 via [place] on entry, placed on RA dropped to 87%O2, 2L supplemental O2 returned and SaO2 rebounded. During mobility, SpO2 dropping to low 70s. Requiring 3L O2 and significant seated rest break for recovery >88%        Assessment/Plan    PT Assessment Patient needs continued PT services  PT Problem List Decreased strength;Decreased activity tolerance;Decreased range of motion;Decreased balance;Decreased mobility;Decreased cognition;Decreased safety awareness;Cardiopulmonary status limiting activity;Obesity       PT Treatment Interventions DME instruction;Gait training;Functional mobility training;Therapeutic activities;Therapeutic exercise;Balance training;Cognitive remediation;Patient/family education    PT Goals (Current goals can be found in the Care Plan section)  Acute Rehab PT Goals Patient Stated Goal: go home PT Goal Formulation: With patient Time For Goal Achievement: 11/03/19 Potential to Achieve Goals: Fair    Frequency Min 2X/week   Barriers to discharge Decreased caregiver support son works during the day       AM-PAC PT "6 Clicks" Mobility  Outcome Measure Help needed turning from your back to your side while in a flat bed without using bedrails?: A Little Help needed moving from lying on your back to sitting on the side of a flat bed without using bedrails?: A Lot Help needed moving to and from a bed to a chair (including a wheelchair)?: A Little Help needed standing up from a chair using your arms (e.g., wheelchair or bedside chair)?: A Little Help needed to  walk in hospital room?: A Little Help needed climbing 3-5 steps with a railing? : A Lot 6 Click Score: 16    End of Session Equipment Utilized During Treatment: Gait belt Activity Tolerance: Patient tolerated treatment well Patient left: in chair;with call bell/phone within reach Nurse Communication: Mobility status PT Visit Diagnosis: Unsteadiness on feet (R26.81);Other abnormalities of gait and mobility (R26.89);Muscle weakness (generalized) (M62.81);Difficulty in walking, not elsewhere classified (R26.2)    Time: [id number] PT Time Calculation (min) (ACUTE ONLY): 33 min   Charges:   PT Evaluation $PT Eval Moderate Complexity: 1 Mod          Margert B. Migdalia Dk PT, DPT Acute Rehabilitation Services Pager [phone] Office [phone]   [place] 10/20/2019, 3:52 PM

## 2019-10-21 LAB — CBC WITH DIFFERENTIAL/PLATELET
Abs Immature Granulocytes: 0.52 10*3/uL — ABNORMAL HIGH (ref 0.00–0.07)
Basophils Absolute: 0 10*3/uL (ref 0.0–0.1)
Basophils Relative: 0 %
Eosinophils Absolute: 0 10*3/uL (ref 0.0–0.5)
Eosinophils Relative: 0 %
HCT: 30.7 % — ABNORMAL LOW (ref 36.0–46.0)
Hemoglobin: 9.3 g/dL — ABNORMAL LOW (ref 12.0–15.0)
Immature Granulocytes: 4 %
Lymphocytes Relative: 4 %
Lymphs Abs: 0.4 10*3/uL — ABNORMAL LOW (ref 0.7–4.0)
MCH: 21 pg — ABNORMAL LOW (ref 26.0–34.0)
MCHC: 30.3 g/dL (ref 30.0–36.0)
MCV: 69.3 fL — ABNORMAL LOW (ref 80.0–100.0)
Monocytes Absolute: 0.6 10*3/uL (ref 0.1–1.0)
Monocytes Relative: 5 %
Neutro Abs: 10.4 10*3/uL — ABNORMAL HIGH (ref 1.7–7.7)
Neutrophils Relative %: 87 %
Platelets: 247 10*3/uL (ref 150–400)
RBC: 4.43 MIL/uL (ref 3.87–5.11)
RDW: 25.1 % — ABNORMAL HIGH (ref 11.5–15.5)
WBC: 12 10*3/uL — ABNORMAL HIGH (ref 4.0–10.5)
nRBC: 0.2 % (ref 0.0–0.2)

## 2019-10-21 LAB — GLUCOSE, CAPILLARY
Glucose-Capillary: 189 mg/dL — ABNORMAL HIGH (ref 70–99)
Glucose-Capillary: 205 mg/dL — ABNORMAL HIGH (ref 70–99)
Glucose-Capillary: 218 mg/dL — ABNORMAL HIGH (ref 70–99)
Glucose-Capillary: 206 mg/dL — ABNORMAL HIGH (ref 70–99)

## 2019-10-21 NOTE — NC FL2 (Signed)
[place] LEVEL OF CARE SCREENING TOOL     IDENTIFICATION  Patient Name: Debra Acosta Birthdate: 1931-07-01 Sex: female Admission Date (Current Location): 10/11/2019  [facility] [place] Number:  Herbalist and Address:         Provider Number: [id number]  Attending Physician Name and Address:  Karie Kirks, DO  Relative Name and Phone Number:  Christoper Fabian Daughter [phone]    Current Level of Care: Hospital Recommended Level of Care: [place] Prior Approval Number:    Date Approved/Denied:   PASRR Number: [id number]  Discharge Plan: SNF    Current Diagnoses: Patient Active Problem List   Diagnosis Date Noted  . Iron deficiency anemia due to chronic blood loss 10/12/2019  . Acute hypoxemic respiratory failure due to COVID-19 ([place]) 10/11/2019  . Hypertension   . Diabetes mellitus without complication ([place])   . Coronary artery disease   . Morbid obesity ([place]) 07/07/2013  . DM2 (diabetes mellitus, type 2) ([place]) 07/07/2013  . Dyslipidemia 07/07/2013  . Bell's palsy 07/07/2013  . Chest pain 07/07/2013  . DOE (dyspnea on exertion) 07/07/2013    Orientation RESPIRATION BLADDER Height & Weight     Self, Time, Situation, Place  O2(4 liters currently) External catheter Weight: 129.3 kg Height:  5\' 5"  (165.1 cm)  BEHAVIORAL SYMPTOMS/MOOD NEUROLOGICAL BOWEL NUTRITION STATUS  (N/A)   Continent Diet(Carb Modified)  AMBULATORY STATUS COMMUNICATION OF NEEDS Skin   Limited Assist Verbally Other (Comment)(Documented as cracking areas on lower legs, and moisture associated skin damage  under breast and on groin)                       Personal Care Assistance Level of Assistance  Bathing, Feeding, Dressing Bathing Assistance: Limited assistance Feeding assistance: Limited assistance Dressing Assistance: Limited assistance     Functional Limitations Info  Sight Sight Info: Impaired        SPECIAL CARE  FACTORS FREQUENCY  PT (By licensed PT), OT (By licensed OT)     PT Frequency: PT x 5 per week OT Frequency: OT x 5 per week            Contractures Contractures Info: (none documented)    Additional Factors Info  Code Status, Allergies, Insulin Sliding Scale, Isolation Precautions Code Status Info: DNR Allergies Info: Penicillins   Insulin Sliding Scale Info: insulin aspart novoLOG injection 0-15 Units TID,insulin detemir LEVEMIR injection 15 Units BID Isolation Precautions Info: COVID positive     Current Medications (10/21/2019):  This is the current hospital active medication list Current Facility-Administered Medications  Medication Dose Route Frequency Provider Last Rate Last Admin  . 0.9 %  sodium chloride infusion  250 mL Intravenous PRN Karmen Bongo, MD      . acetaminophen (TYLENOL) tablet 650 mg  650 mg Oral Q6H PRN Karmen Bongo, MD      . albuterol (VENTOLIN HFA) 108 (90 Base) MCG/ACT inhaler 1 puff  1 puff Inhalation Q4H PRN Martyn Malay, MD      . albuterol (VENTOLIN HFA) 108 (90 Base) MCG/ACT inhaler 2 puff  2 puff Inhalation Q6H Irene Pap N, DO   2 puff at 10/21/19 0846  . ascorbic acid (VITAMIN C) tablet 500 mg  500 mg Oral Daily Amin, Ankit Chirag, MD   500 mg at 10/21/19 0844  . aspirin EC tablet 81 mg  81 mg Oral Daily Karmen Bongo, MD   81 mg at 10/21/19 [id number]  .  bisacodyl (DULCOLAX) EC tablet 5 mg  5 mg Oral Daily PRN Karmen Bongo, MD      . brimonidine (ALPHAGAN) 0.15 % ophthalmic solution 1 drop  1 drop Both Eyes TID Karmen Bongo, MD   1 drop at 10/21/19 0847  . cholecalciferol (VITAMIN D3) tablet 1,000 Units  1,000 Units Oral Daily Kayleen Memos, DO   1,000 Units at 10/21/19 0844  . clopidogrel (PLAVIX) tablet 75 mg  75 mg Oral Daily Karmen Bongo, MD   75 mg at 10/21/19 0843  . dorzolamide (TRUSOPT) 2 % ophthalmic solution 1 drop  1 drop Both Eyes TID Karmen Bongo, MD   1 drop at 10/21/19 0849  . enoxaparin (LOVENOX) injection 65 mg   65 mg Subcutaneous Q24H Karren Cobble, RPH   65 mg at 10/20/19 1429  . ferrous sulfate tablet 325 mg  325 mg Oral TID WC Amin, Ankit Chirag, MD   325 mg at 10/21/19 1242  . guaiFENesin-dextromethorphan (ROBITUSSIN DM) 100-10 MG/5ML syrup 10 mL  10 mL Oral Q4H PRN Karmen Bongo, MD   10 mL at 10/18/19 [id number]  . insulin aspart (novoLOG) injection 0-15 Units  0-15 Units Subcutaneous TID WC Karmen Bongo, MD   5 Units at 10/21/19 1242  . insulin detemir (LEVEMIR) injection 15 Units  15 Units Subcutaneous BID Swayze, Ava, DO   15 Units at 10/21/19 0840  . ipratropium (ATROVENT HFA) inhaler 2 puff  2 puff Inhalation Q6H Hall, Carole N, DO   2 puff at 10/21/19 0847  . latanoprost (XALATAN) 0.005 % ophthalmic solution 1 drop  1 drop Both Eyes QHS Karmen Bongo, MD   1 drop at 10/20/19 2130  . linagliptin (TRADJENTA) tablet 5 mg  5 mg Oral Daily Martyn Malay, MD   5 mg at 10/21/19 0843  . MEDLINE mouth rinse  15 mL Mouth Rinse BID Swayze, Ava, DO   15 mL at 10/21/19 0845  . methylPREDNISolone sodium succinate (SOLU-MEDROL) 40 mg/mL injection 40 mg  40 mg Intravenous Q8H Amin, Ankit Chirag, MD   40 mg at 10/21/19 0550  . mometasone-formoterol (DULERA) 200-5 MCG/ACT inhaler 2 puff  2 puff Inhalation BID Karmen Bongo, MD   2 puff at 10/21/19 0848  . montelukast (SINGULAIR) tablet 10 mg  10 mg Oral Ivery Quale, MD   10 mg at 10/20/19 2128  . ondansetron (ZOFRAN) tablet 4 mg  4 mg Oral Q6H PRN Karmen Bongo, MD       Or  . ondansetron [facility]) injection 4 mg  4 mg Intravenous Q6H PRN Karmen Bongo, MD   4 mg at 10/13/19 1749  . oxyCODONE (Oxy IR/ROXICODONE) immediate release tablet 5 mg  5 mg Oral Q4H PRN Karmen Bongo, MD      . polyethylene glycol (MIRALAX / GLYCOLAX) packet 17 g  17 g Oral Daily PRN Karmen Bongo, MD      . senna-docusate (Senokot-S) tablet 2 tablet  2 tablet Oral QHS PRN Amin, Ankit Chirag, MD      . sodium chloride flush (NS) 0.9 % injection 3 mL  3 mL  Intravenous Q12H Karmen Bongo, MD   3 mL at 10/21/19 0845  . sodium chloride flush (NS) 0.9 % injection 3 mL  3 mL Intravenous PRN Karmen Bongo, MD      . sodium phosphate (FLEET) 7-19 GM/118ML enema 1 enema  1 enema Rectal Once PRN Karmen Bongo, MD      . zinc sulfate capsule 220 mg  220  mg Oral Daily Damita Lack, MD   220 mg at 10/21/19 [id number]     Discharge Medications: Please see discharge summary for a list of discharge medications.  Relevant Imaging Results:  Relevant Lab Results:   Additional Information COVID positive pt(Patients Social Security [id number])  Maryclare Labrador, RN

## 2019-10-21 NOTE — TOC Initial Note (Addendum)
Transition of Care [facility]) - Initial/Assessment Note    Patient Details  Name: Debra Acosta MRN: [id number] Date of Birth: April 21, 1931  [facility] [place]) CM/SW Contact:    Maryclare Labrador, RN Phone Number: 10/21/2019, 11:08 AM  Clinical Narrative:       CM spoke with pt via phone.  Pt is from home with her son.  CM discussed recommendation of SNF with pt - pt informed CM that she would rather go home but deferred conversation to her daughter [place].  CM was able to reach daughter and also informed of recommendation.  Pts daughter informed CM that she needs to discuss recommendation and the need for 24/7 supervision with her brothers.  Daughter requested CM to contact her today around 4:30 for decision - daughter is agreeable for CM to begin SNF process in preparation in case this is the plan.      FL2 completed and pt has been faxed out   Update:  [facility] has accepted, CM reached out to the other facilities to see if pt will offered a bed.  CM spoke with daughter and she is now in agreement for SNF.  CM will leave handoff for weekend CM to check back on bed offers and offer choice to daughter of accepting beds.            Barriers to Discharge: Continued Medical Work up   Patient Goals and CMS Choice   CMS Medicare.gov Compare Post Acute Care list provided to:: Patient Choice offered to / list presented to : Patient, Adult Children  Expected Discharge Plan and Services         Living arrangements for the past 2 months: Single Family Home                                      Prior Living Arrangements/Services Living arrangements for the past 2 months: Single Family Home Lives with:: Adult Children Patient language and need for interpreter reviewed:: Yes Do you feel safe going back to the place where you live?: Yes      Need for Family Participation in Patient Care: Yes (Comment) Care giver support system in place?: Yes (comment)   Criminal  Activity/Legal Involvement Pertinent to Current Situation/Hospitalization: No - Comment as needed  Activities of Daily Living Home Assistive Devices/Equipment: None ADL Screening (condition at time of admission) Patient's cognitive ability adequate to safely complete daily activities?: No Is the patient deaf or have difficulty hearing?: No Does the patient have difficulty seeing, even when wearing glasses/contacts?: No Does the patient have difficulty concentrating, remembering, or making decisions?: No Patient able to express need for assistance with ADLs?: No Does the patient have difficulty dressing or bathing?: No Independently performs ADLs?: Yes (appropriate for developmental age) Does the patient have difficulty walking or climbing stairs?: No Weakness of Legs: None Weakness of Arms/Hands: None  Permission Sought/Granted   Permission granted to share information with : Yes, Verbal Permission Granted  Share Information with NAME: Daughter - [name]           Emotional Assessment   Attitude/Demeanor/Rapport: Engaged Affect (typically observed): Accepting Orientation: : Oriented to Self, Oriented to Place, Oriented to  Time, Oriented to Situation   Psych Involvement: No (comment)  Admission diagnosis:  Acute hypoxemic respiratory failure due to COVID-19 ([place]) [U07.1, J96.01] Patient Active Problem List   Diagnosis Date Noted  . Iron  deficiency anemia due to chronic blood loss 10/12/2019  . Acute hypoxemic respiratory failure due to COVID-19 ([place]) 10/11/2019  . Hypertension   . Diabetes mellitus without complication ([place])   . Coronary artery disease   . Morbid obesity ([place]) 07/07/2013  . DM2 (diabetes mellitus, type 2) ([place]) 07/07/2013  . Dyslipidemia 07/07/2013  . Bell's palsy 07/07/2013  . Chest pain 07/07/2013  . DOE (dyspnea on exertion) 07/07/2013   PCP:  Lucianne Lei, MD Pharmacy:   [facility], [address] AT [address] [place] [address] Phone: [phone] Fax: [phone]  [facility] [place] [place] - [address] [place] [address] Phone: [phone] Fax: [phone]     Social Determinants of Health (SDOH) Interventions    Readmission Risk Interventions No flowsheet data found.

## 2019-10-21 NOTE — Progress Notes (Signed)
PROGRESS NOTE  Debra Acosta [id number] DOB: 08/20/1931 DOA: 10/11/2019 PCP: Lucianne Lei, MD  Brief History   86 year old with history of HTN, HLD, DM2, morbid obesity, CAD status post stent presented with shortness of breath. In the ER patient was noted to be significantly hypoxic requiring high flow nasal cannula, 60% on room air, COVID-19 screening test positive on 10/11/2019. Chest x-ray with bilateral pulmonary infiltrates. TRH asked to admit for COVID-19 viral pneumonia. Started on COVID-19 directed therapies. CTA PE negative for PE on 10/14/2019. Antibiotics started 1/30 as procalcitonin elevated (0.26) on admission.  The patient has been admitted to a telemetry bed. She is receiving IV solumedrol, and has completed a dose of remdesivir. The patient's mental status and compliance with care is greatly improved. Her respiratory status is slowly trending towards baseline.  Consultants  . None  Procedures  . None  Antibiotics   Anti-infectives (From admission, onward)   Start     Dose/Rate Route Frequency Ordered Stop   10/15/19 1230  azithromycin (ZITHROMAX) 500 mg in sodium chloride 0.9 % 250 mL IVPB     500 mg 250 mL/hr over 60 Minutes Intravenous Every 24 hours 10/15/19 1143 10/17/19 1900   10/15/19 1230  cefTRIAXone (ROCEPHIN) 2 g in sodium chloride 0.9 % 100 mL IVPB     2 g 200 mL/hr over 30 Minutes Intravenous Every 24 hours 10/15/19 1143 10/21/19 1319   10/12/19 1000  remdesivir 100 mg in sodium chloride 0.9 % 100 mL IVPB     100 mg 200 mL/hr over 30 Minutes Intravenous Daily 10/11/19 1427 10/15/19 1145   10/11/19 1430  remdesivir 200 mg in sodium chloride 0.9% 250 mL IVPB     200 mg 580 mL/hr over 30 Minutes Intravenous Once 10/11/19 1427 10/11/19 1703     Subjective  The patient is lying quietly. No new complaints.   Objective   Vitals:  Vitals:   10/21/19 0558 10/21/19 1320  BP: 139/60 126/71  Pulse: 74 78  Resp: 16 16  Temp: 97.8 F (36.6 C)  (!) 97.3 F (36.3 C)  SpO2: 94% 93%   Exam:  Constitutional:  . The patient is awake, alert, and oriented x 3. No acute distress. Respiratory:  . No increased work of breathing. . Rales and rhonchi auscultated. Scattered wheezes. . No tactile fremitus Cardiovascular:  . Regular rate and rhythm . No murmurs, ectopy, or gallups. . No lateral PMI. No thrills. Abdomen:  . Abdomen is soft, non-tender, non-distended . Morbidly obese. . No hernias, masses, or organomegaly . Bowel sounds are distant.  Musculoskeletal:  . No cyanosis, clubbing, or edema Skin:  . No rashes, lesions, ulcers . palpation of skin: no induration or nodules Neurologic:  . CN 2-12 intact . Sensation all 4 extremities intact Psychiatric:  . Mental status: Improved.  I have personally reviewed the following:   Today's Data  . Vitals, CBC  Scheduled Meds: . albuterol  2 puff Inhalation Q6H  . vitamin C  500 mg Oral Daily  . aspirin EC  81 mg Oral Daily  . brimonidine  1 drop Both Eyes TID  . cholecalciferol  1,000 Units Oral Daily  . clopidogrel  75 mg Oral Daily  . dorzolamide  1 drop Both Eyes TID  . enoxaparin (LOVENOX) injection  65 mg Subcutaneous Q24H  . ferrous sulfate  325 mg Oral TID WC  . insulin aspart  0-15 Units Subcutaneous TID WC  . insulin detemir  15 Units Subcutaneous BID  .  ipratropium  2 puff Inhalation Q6H  . latanoprost  1 drop Both Eyes QHS  . linagliptin  5 mg Oral Daily  . mouth rinse  15 mL Mouth Rinse BID  . methylPREDNISolone (SOLU-MEDROL) injection  40 mg Intravenous Q8H  . mometasone-formoterol  2 puff Inhalation BID  . montelukast  10 mg Oral QHS  . sodium chloride flush  3 mL Intravenous Q12H  . zinc sulfate  220 mg Oral Daily   Continuous Infusions: . sodium chloride      Principal Problem:   Acute hypoxemic respiratory failure due to COVID-19 [facility]) Active Problems:   Morbid obesity ([place])   DM2 (diabetes mellitus, type 2) ([place])   Dyslipidemia    Hypertension   Diabetes mellitus without complication ([place])   Coronary artery disease   Iron deficiency anemia due to chronic blood loss   LOS: 10 days   A & P  Acute hypoxic respiratory failure down to 60% on room air secondary to COVID-19 viral pneumonia with concern for superimposed bacterial pneumonia given elevated procalcitonin: The patient has completed Remdesivir and Actemra. She continues to receive IV solumedrol and will continue on it for 2 more days. However, her oxygen saturations dropped to 60% on room air. She continues to require 3L by [place]. Continue incentive spirometer and flutter valve and Lasix. She is receiving Tussionex, and Vitamin C &Zinc, vitamin D3. As her procalcitonin is elevated she is now getting Azithromycin and ceftriaxone for possible superinfection. MRSA by PCR is negative. Urine antigen for strep pneumo is negative. Inflammatory markers are trending down. Continue Tussionex twice daily x3 days. Monitor.  Essential hypertension: Blood pressures are more normotensive than low, but without antihypertensives after bolus. Monitor.  Diabetes mellitus type 2 with hyperglycemia: The patient's glucoses are elevated due to IV Steroids. She is now receiving Levemir 12 units bid with SSI and linagliptin. Appreciate diabetes coordinator assistance. Hemoglobin A1c 6.6 on 10/11/2019. FSBS has run 189 - 218 for the last 24 hours. I have increased levemir to 15 unts bid. Montior.   Coronary artery disease: Noted and stable. Pt is being monitored on telemetry. No chest pain. Continue ASA and Plavix.  Iron deficiency anemia: Ferritin is low, on iron supplement, continue to monitor Hgb. Stable. Recommend outpatient evaluation with colonoscopy, consideration of EGD  History of COPD and asthma: Continue home bronchodilators. Maintain O2 saturation greater than 92%.  Morbid obesity with BMI greater than 45: Complicates all cares and increases risk of poor outcome of COVID-19.    History of glaucoma: Continue home meds.  Adrenal adenoma:  Consider evaluation for overproduction of hormones as an outpatient.  I have seen and examined myself. I have spent 30 minutes in her evaluation and care.  DVT prophylaxis: Lovenox SQ Code Status: DNR  Family Communication: Daughter [name] Disposition Plan: Patient is from home. She will need SNF after discharge. The patient is awaiting placement. She is still requiring 3L [place] in order to maintain oxygen saturations in the 90's. Sherell Christoffel, DO [facility] Direct contact: see [email or web address]  7PM-7AM contact night coverage as above 10/21/2019, 7:53 PM  LOS: 6 days

## 2019-10-22 LAB — GLUCOSE, CAPILLARY
Glucose-Capillary: 174 mg/dL — ABNORMAL HIGH (ref 70–99)
Glucose-Capillary: 206 mg/dL — ABNORMAL HIGH (ref 70–99)
Glucose-Capillary: 188 mg/dL — ABNORMAL HIGH (ref 70–99)
Glucose-Capillary: 189 mg/dL — ABNORMAL HIGH (ref 70–99)

## 2019-10-22 MED ORDER — ALBUTEROL SULFATE HFA 108 (90 BASE) MCG/ACT IN AERS: 2.0000 | INHALATION_SPRAY | Freq: Four times a day (QID) | RESPIRATORY_TRACT | 0 refills | Status: AC

## 2019-10-22 MED ORDER — ZINC SULFATE 220 (50 ZN) MG PO CAPS: 220.0000 mg | ORAL_CAPSULE | Freq: Every day | ORAL | 0 refills | Status: AC

## 2019-10-22 MED ORDER — VITAMIN D3 25 MCG PO TABS: 1000.0000 [IU] | ORAL_TABLET | Freq: Every day | ORAL | 0 refills | Status: AC

## 2019-10-22 MED ORDER — FERROUS SULFATE 325 (65 FE) MG PO TABS: 325.0000 mg | ORAL_TABLET | Freq: Every day | ORAL | 0 refills | Status: AC

## 2019-10-22 MED ORDER — IPRATROPIUM BROMIDE HFA 17 MCG/ACT IN AERS: 2.0000 | INHALATION_SPRAY | Freq: Four times a day (QID) | RESPIRATORY_TRACT | 12 refills | Status: AC

## 2019-10-22 MED ORDER — ALBUTEROL SULFATE HFA 108 (90 BASE) MCG/ACT IN AERS: 2.0000 | INHALATION_SPRAY | Freq: Four times a day (QID) | RESPIRATORY_TRACT | Status: DC | PRN

## 2019-10-22 MED ORDER — ASCORBIC ACID 500 MG PO TABS: 500.0000 mg | ORAL_TABLET | Freq: Every day | ORAL | 0 refills | Status: AC

## 2019-10-22 NOTE — Progress Notes (Signed)
PROGRESS NOTE  Debra Acosta [id number] DOB: 07-22-1931 DOA: 10/11/2019 PCP: Lucianne Lei, MD  Brief History   86 year old with history of HTN, HLD, DM2, morbid obesity, CAD status post stent presented with shortness of breath. In the ER patient was noted to be significantly hypoxic requiring high flow nasal cannula, 60% on room air, COVID-19 screening test positive on 10/11/2019. Chest x-ray with bilateral pulmonary infiltrates. TRH asked to admit for COVID-19 viral pneumonia. Started on COVID-19 directed therapies. CTA PE negative for PE on 10/14/2019. Antibiotics started 1/30 as procalcitonin elevated (0.26) on admission.  The patient has been admitted to a telemetry bed. She is receiving IV solumedrol, and has completed a dose of remdesivir. The patient's mental status and compliance with care is greatly improved. Her respiratory has returned to baseline. She is now awating SNF placement.  Consultants  . None  Procedures  . None  Antibiotics   Anti-infectives (From admission, onward)   Start     Dose/Rate Route Frequency Ordered Stop   10/15/19 1230  azithromycin (ZITHROMAX) 500 mg in sodium chloride 0.9 % 250 mL IVPB     500 mg 250 mL/hr over 60 Minutes Intravenous Every 24 hours 10/15/19 1143 10/17/19 1900   10/15/19 1230  cefTRIAXone (ROCEPHIN) 2 g in sodium chloride 0.9 % 100 mL IVPB     2 g 200 mL/hr over 30 Minutes Intravenous Every 24 hours 10/15/19 1143 10/21/19 1319   10/12/19 1000  remdesivir 100 mg in sodium chloride 0.9 % 100 mL IVPB     100 mg 200 mL/hr over 30 Minutes Intravenous Daily 10/11/19 1427 10/15/19 1145   10/11/19 1430  remdesivir 200 mg in sodium chloride 0.9% 250 mL IVPB     200 mg 580 mL/hr over 30 Minutes Intravenous Once 10/11/19 1427 10/11/19 1703     Subjective  The patient is lying quietly. No new complaints.   Objective   Vitals:  Vitals:   10/22/19 0448 10/22/19 1000  BP: (!) 143/55   Pulse:    Resp:    Temp: 97.7 F (36.5  C)   SpO2:  97%   Exam:  Constitutional:  . The patient is awake, alert, and oriented x 3. No acute distress. Respiratory:  . No increased work of breathing. . No wheezes, rales, or rhonchi . No tactile fremitus Cardiovascular:  . Regular rate and rhythm . No murmurs, ectopy, or gallups. . No lateral PMI. No thrills. Abdomen:  . Abdomen is soft, non-tender, non-distended . Morbidly obese. . No hernias, masses, or organomegaly . Bowel sounds are distant.  Musculoskeletal:  . No cyanosis, clubbing, or edema Skin:  . No rashes, lesions, ulcers . palpation of skin: no induration or nodules Neurologic:  . CN 2-12 intact . Sensation all 4 extremities intact Psychiatric:  . Mental status: Improved.  I have personally reviewed the following:   Today's Data  . Vitals, CBC  Scheduled Meds: . vitamin C  500 mg Oral Daily  . aspirin EC  81 mg Oral Daily  . brimonidine  1 drop Both Eyes TID  . cholecalciferol  1,000 Units Oral Daily  . clopidogrel  75 mg Oral Daily  . dorzolamide  1 drop Both Eyes TID  . enoxaparin (LOVENOX) injection  65 mg Subcutaneous Q24H  . ferrous sulfate  325 mg Oral TID WC  . insulin aspart  0-15 Units Subcutaneous TID WC  . insulin detemir  15 Units Subcutaneous BID  . latanoprost  1 drop Both Eyes QHS  .  linagliptin  5 mg Oral Daily  . mouth rinse  15 mL Mouth Rinse BID  . methylPREDNISolone (SOLU-MEDROL) injection  40 mg Intravenous Q8H  . mometasone-formoterol  2 puff Inhalation BID  . montelukast  10 mg Oral QHS  . sodium chloride flush  3 mL Intravenous Q12H  . zinc sulfate  220 mg Oral Daily   Continuous Infusions: . sodium chloride      Principal Problem:   Acute hypoxemic respiratory failure due to COVID-19 [facility]) Active Problems:   Morbid obesity ([place])   DM2 (diabetes mellitus, type 2) ([place])   Dyslipidemia   Hypertension   Diabetes mellitus without complication ([place])   Coronary artery disease   Iron deficiency anemia due to  chronic blood loss   LOS: 11 days   A & P  Acute hypoxic respiratory failure down to 60% on room air secondary to COVID-19 viral pneumonia with concern for superimposed bacterial pneumonia given elevated procalcitonin: The patient has completed Remdesivir and Actemra. She continues to receive IV solumedrol and will continue on it for 2 more days. However, her oxygen saturations dropped to 60% on room air. She is now saturating at 97%. Continue incentive spirometer and flutter valve and Lasix. She is receiving Tussionex, and Vitamin C &Zinc, vitamin D3. As her procalcitonin is elevated she is now getting Azithromycin and ceftriaxone for possible superinfection. MRSA by PCR is negative. Urine antigen for strep pneumo is negative. Inflammatory markers are trending down. Continue Tussionex twice daily x3 days. Monitor.  Essential hypertension: Blood pressures are more normotensive than low, but without antihypertensives after bolus. Monitor.  Diabetes mellitus type 2 with hyperglycemia: The patient's glucoses are elevated due to IV Steroids. She is now receiving Levemir 12 units bid with SSI and linagliptin. Appreciate diabetes coordinator assistance. Hemoglobin A1c 6.6 on 10/11/2019. FSBS has run 174-218 for the last 24 hours. I have increased levemir to 15 unts bid. Montior.   Coronary artery disease: Noted and stable. Pt is being monitored on telemetry. No chest pain. Continue ASA and Plavix.  Iron deficiency anemia: Ferritin is low, on iron supplement, continue to monitor Hgb. Stable. Recommend outpatient evaluation with colonoscopy, consideration of EGD  History of COPD and asthma: Continue home bronchodilators. Maintain O2 saturation greater than 92%.  Morbid obesity with BMI greater than 45: Complicates all cares and increases risk of poor outcome of COVID-19.   History of glaucoma: Continue home meds.  Adrenal adenoma:  Consider evaluation for overproduction of hormones as an  outpatient.  I have seen and examined myself. I have spent 30 minutes in her evaluation and care.  DVT prophylaxis: Lovenox SQ Code Status: DNR  Family Communication: Daughter [name] Disposition Plan: Discharge to SNF when bed is available.  Dhruv Christina, DO [facility] Direct contact: see [email or web address]  7PM-7AM contact night coverage as above 10/22/2019, 3:53 PM  LOS: 6 days

## 2019-10-22 NOTE — TOC Progression Note (Signed)
Transition of Care [facility]) - Progression Note    Patient Details  Name: Debra Acosta MRN: [id number] Date of Birth: Oct 01, 1930  Transition of Care [facility] CM/SW [place], [place] Phone Number: 10/22/2019, 12:47 PM  Clinical Narrative:    CSW spoke with pt's daughter Debra Acosta by phone.  CSW updated daughter on SNF acceptance.  Pt's daughter would like to continue to wait for other SNF acceptance for pt. TOC team will continue to follow for disposition planning.     Barriers to Discharge: Continued Medical Work up  Expected Discharge Plan and Services         Living arrangements for the past 2 months: Single Family Home Expected Discharge Date: 10/22/19                                     Social Determinants of Health (SDOH) Interventions    Readmission Risk Interventions No flowsheet data found.

## 2019-10-22 NOTE — Discharge Summary (Signed)
Physician Discharge Summary  Debra Acosta [id number] DOB: 1930-10-20 DOA: 10/11/2019  PCP: Lucianne Lei, MD  Admit date: 10/11/2019 Discharge date: 10/22/2019  Recommendations for Outpatient Follow-up:  Discharge to SNF with PT/OT Follow up with PCP in 7-10 days  Discharge Diagnoses: Principal diagnosis is #1 1. Acute hypoxic respiratory failure 2. Essential hypertension 3. DM II 4. CAD 5. Fe deficiency anemia 6. COPD and asthma 7. Morbid obesity 8. Adrenal adenoma 9. History of glaucoma  Discharge Condition: Fair  Disposition: SNF  Diet recommendation: Heart healthy with modified carbohydrates  Filed Weights   10/11/19 1135  Weight: 129.3 kg    History of present illness:  Debra Acosta is a 86 y.o. female with medical history significant of HTN; HLD; DM; morbid obesity (BMI 47); and CAD s/p stents presenting with SOB.  She lives with her son, who moves furniture.  I spoke with her older son: They ate on Sunday and it took her longer to get around than usual.   He was concerned about this and thinks that she got the virus from her younger son last week.  The patient reports that she has been short-winded for about a week.  She went to her PCP today and was told to come to the ER for further evaluation.  She reports that she "just has a bronchitis or something."  She denies URI symptoms other than SOB.  She thinks she may have lost her sense of taste.  She has noticed some chest discomfort with the SOB.  No GI symptoms.  Hospital Course:  86 year old with history of HTN, HLD, DM2, morbid obesity, CAD status post stent presented with shortness of breath. In the ER patient was noted to be significantly hypoxic requiring high flow nasal cannula, 60% on room air, COVID-19 screening test positive on 10/11/2019. Chest x-ray with bilateral pulmonary infiltrates. TRH asked to admit for COVID-19 viral pneumonia. Started on COVID-19 directed therapies. CTA PE negative for  PE on 10/14/2019. Antibiotics started 1/30 as procalcitonin elevated (0.26) on admission.  The patient has been admitted to a telemetry bed. She is receiving IV solumedrol, and has completed a dose of remdesivir. The patient's mental status and compliance with care is greatly improved. Her respiratory has returned to baseline. She is now awating SNF placement.  Today's assessment: S: The patient is resting comfortably. No new complaints. O: Vitals:  Vitals:   10/22/19 0448 10/22/19 1000  BP: (!) 143/55   Pulse:    Resp:    Temp: 97.7 F (36.5 C)   SpO2:  97%    Exam:  Exam:  Constitutional:   The patient is awake, alert, and oriented x 3. No acute distress. Respiratory:   No increased work of breathing.  No wheezes, rales, or rhonchi  No tactile fremitus Cardiovascular:   Regular rate and rhythm  No murmurs, ectopy, or gallups.  No lateral PMI. No thrills. Abdomen:   Abdomen is soft, non-tender, non-distended  Morbidly obese.  No hernias, masses, or organomegaly  Bowel sounds are distant.  Musculoskeletal:   No cyanosis, clubbing, or edema Skin:   No rashes, lesions, ulcers  palpation of skin: no induration or nodules Neurologic:   CN 2-12 intact  Sensation all 4 extremities intact Psychiatric:  Mental status: Improved.  Discharge Instructions  Discharge Instructions    Activity as tolerated - No restrictions   Complete by: As directed    Call MD for:  difficulty breathing, headache or visual disturbances   Complete by:  As directed    Diet - low sodium heart healthy   Complete by: As directed    Diet Carb Modified   Complete by: As directed    Discharge instructions   Complete by: As directed    Discharge to SNF with PT/OT Follow up with PCP in 7-10 days   Increase activity slowly   Complete by: As directed      Allergies as of 10/22/2019      Reactions   Penicillins Other (See Comments)   Unknown      Medication List    STOP  taking these medications   irbesartan 150 MG tablet Commonly known as: AVAPRO     TAKE these medications   albuterol 108 (90 Base) MCG/ACT inhaler Commonly known as: VENTOLIN HFA Inhale 2 puffs into the lungs every 6 (six) hours.   ascorbic acid 500 MG tablet Commonly known as: VITAMIN C Take 1 tablet (500 mg total) by mouth daily. Start taking on: October 23, 2019   aspirin EC 81 MG tablet Take 81 mg by mouth daily.   brimonidine 0.1 % Soln Commonly known as: ALPHAGAN P Place 1 drop into both eyes 3 (three) times daily.   clopidogrel 75 MG tablet Commonly known as: PLAVIX Take 75 mg by mouth daily.   docusate sodium 100 MG capsule Commonly known as: COLACE Take 1 capsule (100 mg total) by mouth every 12 (twelve) hours. What changed:   when to take this  reasons to take this   dorzolamide 2 % ophthalmic solution Commonly known as: TRUSOPT Place 1 drop into both eyes 3 (three) times daily.   ferrous sulfate 325 (65 FE) MG tablet Take 1 tablet (325 mg total) by mouth daily with breakfast.   Fluticasone-Salmeterol 250-50 MCG/DOSE Aepb Commonly known as: ADVAIR Inhale 1 puff into the lungs daily.   ipratropium 17 MCG/ACT inhaler Commonly known as: ATROVENT HFA Inhale 2 puffs into the lungs every 6 (six) hours.   latanoprost 0.005 % ophthalmic solution Commonly known as: XALATAN Place 1 drop into both eyes at bedtime.   metFORMIN 500 MG tablet Commonly known as: GLUCOPHAGE Take 1,000 mg by mouth at bedtime.   montelukast 10 MG tablet Commonly known as: SINGULAIR Take 10 mg by mouth at bedtime.   nitroGLYCERIN 0.4 MG SL tablet Commonly known as: NITROSTAT Place 0.4 mg under the tongue every 5 (five) minutes as needed for chest pain.   Vitamin D3 25 MCG tablet Commonly known as: Vitamin D Take 1 tablet (1,000 Units total) by mouth daily. Start taking on: October 23, 2019   zinc sulfate 220 (50 Zn) MG capsule Take 1 capsule (220 mg total) by mouth  daily. Start taking on: October 23, 2019      Allergies  Allergen Reactions  . Penicillins Other (See Comments)    Unknown     The results of significant diagnostics from this hospitalization (including imaging, microbiology, ancillary and laboratory) are listed below for reference.    Significant Diagnostic Studies: CT ANGIO CHEST PE W OR WO CONTRAST  Result Date: 10/14/2019 CLINICAL DATA:  Positive for COVID. High pretest probability for pulmonary embolism. EXAM: CT ANGIOGRAPHY CHEST WITH CONTRAST TECHNIQUE: Multidetector CT imaging of the chest was performed using the standard protocol during bolus administration of intravenous contrast. Multiplanar CT image reconstructions and MIPs were obtained to evaluate the vascular anatomy. CONTRAST:  161mL OMNIPAQUE IOHEXOL 350 MG/ML SOLN COMPARISON:  None. FINDINGS: Cardiovascular: Normal heart size. No pericardial effusion. Extensive aortic and  coronary atherosclerosis. There is irregular plaque along the aortic arch with a leftward projecting small aneurysm measuring 7 mm base to dome. Limited by motion artifact and bolus dispersion. No evidence of pulmonary embolism to the lobar/proximal segmental level. Mediastinum/Nodes: Negative for adenopathy or mass. Lungs/Pleura: Patchy ground-glass opacity throughout the bilateral lungs. Intervening unaffected lung has a normal appearance with no [employer]. No effusion or pneumothorax Upper Abdomen: Right adrenal mass. Suspect this is an adenoma, but not mentioned on abdominal CT report from 2001. Musculoskeletal: Spondylosis with bridging osteophytes. Bulky endplate spurring at the lower cervical spine with severe paring spinal stenosis on axial slices. No acute or aggressive finding. Review of the MIP images confirms the above findings. IMPRESSION: 1. Limited CTA with no evidence of pulmonary embolism. Diagnostic certainty significantly declines beyond the proximal segmental level. 2. Ground-glass pneumonia  consistent with COVID-19 positivity. 3. Aortic Atherosclerosis (ICD10-I70.0) with a small aneurysmal outpouching leftward from the aortic isthmus. 4. Spondyloarthropathy. Bulky endplate spurring in the lower cervical spine causes prominent spinal stenosis. 5. 2.4 cm right adrenal mass most often an adenoma in a patient without malignancy history. Electronically Signed   By: Monte Fantasia M.D.   On: 10/14/2019 11:47   DG Chest Port 1 View  Result Date: 10/11/2019 CLINICAL DATA:  Shortness of breath EXAM: PORTABLE CHEST 1 VIEW COMPARISON:  08/11/2005 FINDINGS: Mild cardiomegaly. Aortic atherosclerosis and tortuosity. Widespread hazy pulmonary infiltrates worrisome for viral pneumonia. The differential diagnosis is fluid overload/early congestive heart failure. No visible effusion. No acute bone finding. IMPRESSION: Widespread hazy pulmonary infiltrates worrisome for viral pneumonia. Differential diagnosis is fluid overload/CHF. Electronically Signed   By: Nelson Chimes M.D.   On: 10/11/2019 12:36    Microbiology: Recent Results (from the past 240 hour(s))  MRSA PCR Screening     Status: None   Collection Time: 10/15/19 12:43 PM   Specimen: Nasopharyngeal  Result Value Ref Range Status   MRSA by PCR NEGATIVE NEGATIVE Final    Comment:        The GeneXpert MRSA Assay (FDA approved for NASAL specimens only), is one component of a comprehensive MRSA colonization surveillance program. It is not intended to diagnose MRSA infection nor to guide or monitor treatment for MRSA infections. Performed at [facility], [place] [address] [place], [place] [address]      Labs: Basic Metabolic Panel: Recent Labs  Lab 10/16/19 0259 10/18/19 0405 10/20/19 0334  NA 144 139 137  K 4.9 4.1 4.6  CL 103 104 101  CO2 30 27 26   GLUCOSE 213* 222* 226*  BUN 31* 36* 42*  CREATININE 1.31* 1.07* 1.01*  CALCIUM 8.3* 8.0* 8.2*  MG 2.8*  --   --   PHOS 4.4  --   --    Liver Function Tests: Recent Labs   Lab 10/16/19 0259  AST 21  ALT 20  ALKPHOS 97  BILITOT 0.5  PROT 6.0*  ALBUMIN 2.7*   No results for input(s): LIPASE, AMYLASE in the last 168 hours. No results for input(s): AMMONIA in the last 168 hours. CBC: Recent Labs  Lab 10/16/19 0259 10/18/19 0405 10/20/19 0334 10/21/19 0609  WBC 11.1* 9.9 9.6 12.0*  NEUTROABS 9.7* 8.8* 8.6* 10.4*  HGB 8.8* 8.9* 8.9* 9.3*  HCT 30.5* 30.0* 30.5* 30.7*  MCV 70.8* 69.6* 70.6* 69.3*  PLT 351 PLATELET CLUMPS NOTED ON SMEAR, UNABLE TO ESTIMATE 267 247   Cardiac Enzymes: No results for input(s): CKTOTAL, CKMB, CKMBINDEX, TROPONINI in the last 168  hours. BNP: BNP (last 3 results) Recent Labs    10/11/19 1200  BNP 76.4    ProBNP (last 3 results) No results for input(s): PROBNP in the last 8760 hours.  CBG: Recent Labs  Lab 10/21/19 1155 10/21/19 1726 10/21/19 2119 10/22/19 0749 10/22/19 1201  GLUCAP 205* 218* 206* 174* 206*    Principal Problem:   Acute hypoxemic respiratory failure due to COVID-19 [facility]) Active Problems:   Morbid obesity ([place])   DM2 (diabetes mellitus, type 2) ([place])   Dyslipidemia   Hypertension   Diabetes mellitus without complication ([place])   Coronary artery disease   Iron deficiency anemia due to chronic blood loss   Time coordinating discharge: 38 minutes.  Signed:        Forney Kleinpeter, DO [facility]  10/22/2019, 4:15 PM

## 2019-10-23 LAB — GLUCOSE, CAPILLARY
Glucose-Capillary: 170 mg/dL — ABNORMAL HIGH (ref 70–99)
Glucose-Capillary: 182 mg/dL — ABNORMAL HIGH (ref 70–99)
Glucose-Capillary: 186 mg/dL — ABNORMAL HIGH (ref 70–99)
Glucose-Capillary: 177 mg/dL — ABNORMAL HIGH (ref 70–99)

## 2019-10-23 NOTE — Progress Notes (Signed)
PROGRESS NOTE  Debra Acosta [id number] DOB: [birth year] DOA: 10/11/2019 PCP: Lucianne Lei, MD  Brief History   86 year old with history of HTN, HLD, DM2, morbid obesity, CAD status post stent presented with shortness of breath. In the ER patient was noted to be significantly hypoxic requiring high flow nasal cannula, 60% on room air, COVID-19 screening test positive on 10/11/2019. Chest x-ray with bilateral pulmonary infiltrates. TRH asked to admit for COVID-19 viral pneumonia. Started on COVID-19 directed therapies. CTA PE negative for PE on 10/14/2019. Antibiotics started 1/30 as procalcitonin elevated (0.26) on admission.  The patient has been admitted to a telemetry bed. She is receiving IV solumedrol, and has completed a dose of remdesivir. The patient's mental status and compliance with care is greatly improved. Her respiratory has returned to baseline. She is now awating SNF placement.  Consultants  . None  Procedures  . None  Antibiotics   Anti-infectives (From admission, onward)   Start     Dose/Rate Route Frequency Ordered Stop   10/15/19 1230  azithromycin (ZITHROMAX) 500 mg in sodium chloride 0.9 % 250 mL IVPB     500 mg 250 mL/hr over 60 Minutes Intravenous Every 24 hours 10/15/19 1143 10/17/19 1900   10/15/19 1230  cefTRIAXone (ROCEPHIN) 2 g in sodium chloride 0.9 % 100 mL IVPB     2 g 200 mL/hr over 30 Minutes Intravenous Every 24 hours 10/15/19 1143 10/21/19 1319   10/12/19 1000  remdesivir 100 mg in sodium chloride 0.9 % 100 mL IVPB     100 mg 200 mL/hr over 30 Minutes Intravenous Daily 10/11/19 1427 10/15/19 1145   10/11/19 1430  remdesivir 200 mg in sodium chloride 0.9% 250 mL IVPB     200 mg 580 mL/hr over 30 Minutes Intravenous Once 10/11/19 1427 10/11/19 1703     Subjective  The patient is lying quietly. No new complaints.   Objective   Vitals:  Vitals:   10/23/19 0529 10/23/19 0530  BP:  (!) 147/59  Pulse:  72  Resp:  17  Temp: 97.7 F  (36.5 C) 97.7 F (36.5 C)  SpO2:  96%   Exam:  Constitutional:  . The patient is awake, alert, and oriented x 3. No acute distress. Respiratory:  . No increased work of breathing. . No wheezes, rales, or rhonchi . No tactile fremitus Cardiovascular:  . Regular rate and rhythm . No murmurs, ectopy, or gallups. . No lateral PMI. No thrills. Abdomen:  . Abdomen is soft, non-tender, non-distended . Morbidly obese. . No hernias, masses, or organomegaly . Bowel sounds are distant.  Musculoskeletal:  . No cyanosis, clubbing, or edema Skin:  . No rashes, lesions, ulcers . palpation of skin: no induration or nodules Neurologic:  . CN 2-12 intact . Sensation all 4 extremities intact Psychiatric:  . Mental status: Improved.  I have personally reviewed the following:   Today's Data  . Vitals, CBC  Scheduled Meds: . vitamin C  500 mg Oral Daily  . aspirin EC  81 mg Oral Daily  . brimonidine  1 drop Both Eyes TID  . cholecalciferol  1,000 Units Oral Daily  . clopidogrel  75 mg Oral Daily  . dorzolamide  1 drop Both Eyes TID  . enoxaparin (LOVENOX) injection  65 mg Subcutaneous Q24H  . ferrous sulfate  325 mg Oral TID WC  . insulin aspart  0-15 Units Subcutaneous TID WC  . insulin detemir  15 Units Subcutaneous BID  . latanoprost  1 drop  Both Eyes QHS  . linagliptin  5 mg Oral Daily  . mouth rinse  15 mL Mouth Rinse BID  . methylPREDNISolone (SOLU-MEDROL) injection  40 mg Intravenous Q8H  . mometasone-formoterol  2 puff Inhalation BID  . montelukast  10 mg Oral QHS  . sodium chloride flush  3 mL Intravenous Q12H  . zinc sulfate  220 mg Oral Daily   Continuous Infusions: . sodium chloride      Principal Problem:   Acute hypoxemic respiratory failure due to COVID-19 [facility]) Active Problems:   Morbid obesity ([place])   DM2 (diabetes mellitus, type 2) ([place])   Dyslipidemia   Hypertension   Diabetes mellitus without complication ([place])   Coronary artery disease   Iron  deficiency anemia due to chronic blood loss   LOS: 12 days   A & P  Acute hypoxic respiratory failure down to 60% on room air secondary to COVID-19 viral pneumonia with concern for superimposed bacterial pneumonia given elevated procalcitonin: The patient has completed Remdesivir and Actemra. She continues to receive IV solumedrol and will continue on it for 2 more days. However, her oxygen saturations dropped to 60% on room air. She is now saturating at 97%. Continue incentive spirometer and flutter valve and Lasix. She is receiving Tussionex, and Vitamin C &Zinc, vitamin D3. As her procalcitonin is elevated she is now getting Azithromycin and ceftriaxone for possible superinfection. MRSA by PCR is negative. Urine antigen for strep pneumo is negative. Inflammatory markers are trending down. Continue Tussionex twice daily x3 days. Monitor.  Essential hypertension: Blood pressures are more normotensive than low, but without antihypertensives after bolus. Monitor.  Diabetes mellitus type 2 with hyperglycemia: The patient's glucoses are elevated due to IV Steroids. She is now receiving Levemir 12 units bid with SSI and linagliptin. Appreciate diabetes coordinator assistance. Hemoglobin A1c 6.6 on 10/11/2019. FSBS has run 170-189 for the last 24 hours. I have increased levemir to 15 unts bid. [place].   Coronary artery disease: Noted and stable. Pt is being monitored on telemetry. No chest pain. Continue ASA and Plavix.  Iron deficiency anemia: Ferritin is low, on iron supplement, continue to monitor Hgb. Stable. Recommend outpatient evaluation with colonoscopy, consideration of EGD  History of COPD and asthma: Continue home bronchodilators. Maintain O2 saturation greater than 92%.  Morbid obesity with BMI greater than 45: Complicates all cares and increases risk of poor outcome of COVID-19.   History of glaucoma: Continue home meds.  Adrenal adenoma:  Consider evaluation for  overproduction of hormones as an outpatient.  I have seen and examined myself. I have spent 32 minutes in her evaluation and care.  DVT prophylaxis: Lovenox SQ Code Status: DNR  Family Communication: None available Disposition Plan: Discharge to SNF when bed is available.  Jahzeel Poythress, DO [facility] Direct contact: see [email or web address]  7PM-7AM contact night coverage as above 10/22/2019, 3:53 PM  LOS: 6 days

## 2019-10-23 NOTE — Plan of Care (Signed)
  Problem: Coping: Goal: Psychosocial and spiritual needs will be supported Outcome: Progressing   Problem: Respiratory: Goal: Will maintain a patent airway Outcome: Progressing   

## 2019-10-24 LAB — GLUCOSE, CAPILLARY
Glucose-Capillary: 148 mg/dL — ABNORMAL HIGH (ref 70–99)
Glucose-Capillary: 156 mg/dL — ABNORMAL HIGH (ref 70–99)
Glucose-Capillary: 174 mg/dL — ABNORMAL HIGH (ref 70–99)
Glucose-Capillary: 157 mg/dL — ABNORMAL HIGH (ref 70–99)
Glucose-Capillary: 142 mg/dL — ABNORMAL HIGH (ref 70–99)

## 2019-10-24 NOTE — Progress Notes (Signed)
[facility] arrived to transport patient to [facility] at aproximately 2100.  [facility] was called, and per the Director of Nursing, they cannot accept patients after 2100.  I explained to the person I spoke with at [facility] that the patient had been medically cleared for discharge and that report had already been called for the patient by the day shift RN.  [facility] continued to state that they could not accept new arrivals after 2100.  Patient will therefore remain in 5w20 and [facility] will be called again on 2/9 at 0600 to attempt transport again.

## 2019-10-24 NOTE — TOC Transition Note (Signed)
Transition of Care [facility]) - CM/SW Discharge Note   Patient Details  Name: Debra Acosta MRN: [id number] Date of Birth: 1930-11-11  Transition of Care [facility]) CM/SW Contact:  Benard Halsted, LCSW Phone Number: 10/24/2019, 2:30 PM   Clinical Narrative:    Patient will DC to: [place] date: 10/24/19 Family notified: Daughter, Tax adviser by: Corey Harold 3:30pm   Per MD patient ready for DC to [facility]. RN, patient, patient's family, and facility notified of DC. Discharge Summary and FL2 sent to facility. RN to call report prior to discharge [phone]). DC packet on chart. Ambulance transport requested for patient.   CSW will sign off for now as social work intervention is no longer needed. Please consult [place] again if new needs arise.  Cedric Fishman, LCSW Clinical Social Worker [phone]    Final next level of care: Skilled Nursing Facility Barriers to Discharge: No Barriers Identified   Patient Goals and CMS Choice Patient states their goals for this hospitalization and ongoing recovery are:: Rehab CMS Medicare.gov Compare Post Acute Care list provided to:: Patient Choice offered to / list presented to : Patient, Adult Children  Discharge Placement   Existing PASRR number confirmed : 10/24/19          Patient [facility] bed at: [facility] Patient to be transferred to facility by: [place] Name of family member notified: Daughter, [name] Patient and family notified of of transfer: 10/24/19  Discharge Plan and Services                                     Social Determinants of Health (SDOH) Interventions     Readmission Risk Interventions No flowsheet data found.

## 2019-10-24 NOTE — TOC Progression Note (Addendum)
Transition of Care [facility]) - Progression Note    Patient Details  Name: DANAISHA BAEZ MRN: [id number] Date of Birth: 1931/06/25  Transition of Care [facility]) CM/SW [place], LCSW Phone Number: 10/24/2019, 9:50 AM  Clinical Narrative:    CSW submitted for insurance approval (SNF and PTAR). Following up on SNF bed availability.   9:50am-CSW spoke with patient's daughter, [name] and provided bed offers. She is in agreement with [facility]. They have a bed for today once insurance auth comes through.  2:30pm-Insurance approval received for 7 days: #[id number]. Enzo BiGI:[id number]      Barriers to Discharge: Continued Medical Work up  Expected Discharge Plan and [place] arrangements for the past 2 months: Single Family Home Expected Discharge Date: 10/24/19                                     Social Determinants of Health (SDOH) Interventions    Readmission Risk Interventions No flowsheet data found.

## 2019-10-24 NOTE — Progress Notes (Signed)
Physical Therapy Treatment Patient Details Name: Debra Acosta MRN: [id number] DOB: Nov 25, 1930 Today's Date: 10/24/2019    History of Present Illness 86 yo female presenting with ED with significant hypoxic requiring high flow nasal cannula, 60% on room air. Tested COVID-19 positive on 10/11/19. Chest x-ray with bilateral pulmonary infiltrates.   CTA PE negative for PE on 10/14/2019. PMH including  HTN, HLD, DM2, morbid obesity, CAD status post stent.    PT Comments    Continues to make slow, steady progress. Continue to recommend ST-SNF.    Follow Up Recommendations  SNF     Equipment Recommendations  None recommended by PT    Recommendations for Other Services       Precautions / Restrictions Precautions Precautions: Fall Restrictions Weight Bearing Restrictions: No    Mobility  Bed Mobility Overal bed mobility: Needs Assistance Bed Mobility: Supine to Sit     Supine to sit: Min assist     General bed mobility comments: Assist to elevate trunk into sitting. Pt used rail and therapist's hand to pull up  Transfers Overall transfer level: Needs assistance Equipment used: Rolling walker (2 wheeled) Transfers: Sit to/from Stand Sit to Stand: +2 safety/equipment;Min assist         General transfer comment: Assist to bring hips up. Pt pulls up on walker despited cues.  Ambulation/Gait Ambulation/Gait assistance: Min assist;+2 safety/equipment Gait Distance (Feet): 5 Feet(x 2) Assistive device: Rolling walker (2 wheeled) Gait Pattern/deviations: Step-to pattern;Decreased step length - right;Decreased step length - left;Shuffle;Wide base of support;Trunk flexed Gait velocity: decr Gait velocity interpretation: <1.31 ft/sec, indicative of household ambulator General Gait Details: Assist for balance. At times pt leans forward and props forearms on walker   Stairs             Wheelchair Mobility    Modified Rankin (Stroke Patients Only)        Balance Overall balance assessment: Needs assistance Sitting-balance support: No upper extremity supported;Feet supported Sitting balance-Leahy Scale: Fair     Standing balance support: Bilateral upper extremity supported;During functional activity Standing balance-Leahy Scale: Poor Standing balance comment: walker and min guard for static standing                            Cognition Arousal/Alertness: Awake/alert Behavior During Therapy: Flat affect Overall Cognitive Status: No family/caregiver present to determine baseline cognitive functioning Area of Impairment: Following commands;Problem solving;Safety/judgement                       Following Commands: Follows multi-step commands with increased time Safety/Judgement: Decreased awareness of safety;Decreased awareness of deficits   Problem Solving: Slow processing;Requires verbal cues        Exercises      General Comments General comments (skin integrity, edema, etc.): On RA with VSS.       Pertinent Vitals/Pain Pain Assessment: Faces Faces Pain Scale: No hurt    Home Living                      Prior Function            PT Goals (current goals can now be found in the care plan section) Acute Rehab PT Goals Patient Stated Goal: go home Progress towards PT goals: Progressing toward goals    Frequency    Min 2X/week      PT Plan Current plan remains appropriate    Co-evaluation  AM-PAC PT "6 Clicks" Mobility   Outcome Measure  Help needed turning from your back to your side while in a flat bed without using bedrails?: A Little Help needed moving from lying on your back to sitting on the side of a flat bed without using bedrails?: A Little Help needed moving to and from a bed to a chair (including a wheelchair)?: A Little Help needed standing up from a chair using your arms (e.g., wheelchair or bedside chair)?: A Little Help needed to walk in hospital  room?: A Little Help needed climbing 3-5 steps with a railing? : A Lot 6 Click Score: 17    End of Session   Activity Tolerance: Patient limited by fatigue Patient left: in chair;with call bell/phone within reach Nurse Communication: Mobility status PT Visit Diagnosis: Unsteadiness on feet (R26.81);Other abnormalities of gait and mobility (R26.89);Muscle weakness (generalized) (M62.81);Difficulty in walking, not elsewhere classified (R26.2)     Time: 1045-1100 PT Time Calculation (min) (ACUTE ONLY): 15 min  Charges:                        [place] Pager [phone] Office [place] 10/24/2019, 2:18 PM

## 2019-10-24 NOTE — Care Management Important Message (Signed)
Important Message  Patient Details  Name: JOVON MAGADAN MRN: [id number] Date of Birth: 06/04/1931   Medicare Important Message Given:  Yes - Important Message mailed due to current National Emergency  Verbal consent obtained due to current National Emergency  Relationship to patient: Child Contact Name: Clemintine Pickard Call Date: 10/24/19  Time: 1035 Phone: [phone] Outcome: Spoke with contact Important Message mailed to: Patient address on file    Delorse Lek 10/24/2019, 10:36 AM

## 2019-10-24 NOTE — Discharge Summary (Deleted)
Physician Discharge Summary  Debra Acosta [id number] DOB: 1931-01-06 DOA: 10/11/2019  PCP: Lucianne Lei, MD  Admit date: 10/11/2019 Discharge date: 10/24/2019  Recommendations for Outpatient Follow-up:  Discharge to SNF with PT/OT Follow up with PCP in 7-10 days  Discharge Diagnoses: Principal diagnosis is #1 1. Acute hypoxic respiratory failure 2. Essential hypertension 3. DM II 4. CAD 5. Fe deficiency anemia 6. COPD and asthma 7. Morbid obesity 8. Adrenal adenoma 9. History of glaucoma  Discharge Condition: Fair  Disposition: SNF  Diet recommendation: Heart healthy with modified carbohydrates  Filed Weights   10/11/19 1135  Weight: 129.3 kg    History of present illness:  Debra Acosta is a 86 y.o. female with medical history significant of HTN; HLD; DM; morbid obesity (BMI 47); and CAD s/p stents presenting with SOB.  She lives with her son, who moves furniture.  I spoke with her older son: They ate on Sunday and it took her longer to get around than usual.   He was concerned about this and thinks that she got the virus from her younger son last week.  The patient reports that she has been short-winded for about a week.  She went to her PCP today and was told to come to the ER for further evaluation.  She reports that she "just has a bronchitis or something."  She denies URI symptoms other than SOB.  She thinks she may have lost her sense of taste.  She has noticed some chest discomfort with the SOB.  No GI symptoms.  Hospital Course:  86 year old with history of HTN, HLD, DM2, morbid obesity, CAD status post stent presented with shortness of breath. In the ER patient was noted to be significantly hypoxic requiring high flow nasal cannula, 60% on room air, COVID-19 screening test positive on 10/11/2019. Chest x-ray with bilateral pulmonary infiltrates. TRH asked to admit for COVID-19 viral pneumonia. Started on COVID-19 directed therapies. CTA PE negative for  PE on 10/14/2019. Antibiotics started 1/30 as procalcitonin elevated (0.26) on admission.  The patient has been admitted to a telemetry bed. She is receiving IV solumedrol, and has completed a dose of remdesivir. The patient's mental status and compliance with care is greatly improved. Her respiratory has returned to baseline. She is now awating SNF placement.  Today's assessment: S: The patient is resting comfortably. No new complaints. O: Vitals:  Vitals:   10/24/19 0100 10/24/19 0509  BP:  (!) 154/70  Pulse: 66 69  Resp: 18 19  Temp:  97.7 F (36.5 C)  SpO2: 95% 96%    Exam:  Exam:  Constitutional:   The patient is awake, alert, and oriented x 3. No acute distress. Respiratory:   No increased work of breathing.  No wheezes, rales, or rhonchi  No tactile fremitus Cardiovascular:   Regular rate and rhythm  No murmurs, ectopy, or gallups.  No lateral PMI. No thrills. Abdomen:   Abdomen is soft, non-tender, non-distended  Morbidly obese.  No hernias, masses, or organomegaly  Bowel sounds are distant.  Musculoskeletal:   No cyanosis, clubbing, or edema Skin:   No rashes, lesions, ulcers  palpation of skin: no induration or nodules Neurologic:   CN 2-12 intact  Sensation all 4 extremities intact Psychiatric:  Mental status: Improved.  Discharge Instructions  Discharge Instructions    Activity as tolerated - No restrictions   Complete by: As directed    Call MD for:  difficulty breathing, headache or visual disturbances   Complete by:  As directed    Diet - low sodium heart healthy   Complete by: As directed    Diet Carb Modified   Complete by: As directed    Discharge instructions   Complete by: As directed    Discharge to SNF with PT/OT Follow up with PCP in 7-10 days   Increase activity slowly   Complete by: As directed      Allergies as of 10/24/2019      Reactions   Penicillins Other (See Comments)   Unknown      Medication List     STOP taking these medications   irbesartan 150 MG tablet Commonly known as: AVAPRO     TAKE these medications   albuterol 108 (90 Base) MCG/ACT inhaler Commonly known as: VENTOLIN HFA Inhale 2 puffs into the lungs every 6 (six) hours.   ascorbic acid 500 MG tablet Commonly known as: VITAMIN C Take 1 tablet (500 mg total) by mouth daily.   aspirin EC 81 MG tablet Take 81 mg by mouth daily.   brimonidine 0.1 % Soln Commonly known as: ALPHAGAN P Place 1 drop into both eyes 3 (three) times daily.   clopidogrel 75 MG tablet Commonly known as: PLAVIX Take 75 mg by mouth daily.   docusate sodium 100 MG capsule Commonly known as: COLACE Take 1 capsule (100 mg total) by mouth every 12 (twelve) hours. What changed:   when to take this  reasons to take this   dorzolamide 2 % ophthalmic solution Commonly known as: TRUSOPT Place 1 drop into both eyes 3 (three) times daily.   ferrous sulfate 325 (65 FE) MG tablet Take 1 tablet (325 mg total) by mouth daily with breakfast.   Fluticasone-Salmeterol 250-50 MCG/DOSE Aepb Commonly known as: ADVAIR Inhale 1 puff into the lungs daily.   ipratropium 17 MCG/ACT inhaler Commonly known as: ATROVENT HFA Inhale 2 puffs into the lungs every 6 (six) hours.   latanoprost 0.005 % ophthalmic solution Commonly known as: XALATAN Place 1 drop into both eyes at bedtime.   metFORMIN 500 MG tablet Commonly known as: GLUCOPHAGE Take 1,000 mg by mouth at bedtime.   montelukast 10 MG tablet Commonly known as: SINGULAIR Take 10 mg by mouth at bedtime.   nitroGLYCERIN 0.4 MG SL tablet Commonly known as: NITROSTAT Place 0.4 mg under the tongue every 5 (five) minutes as needed for chest pain.   Vitamin D3 25 MCG tablet Commonly known as: Vitamin D Take 1 tablet (1,000 Units total) by mouth daily.   zinc sulfate 220 (50 Zn) MG capsule Take 1 capsule (220 mg total) by mouth daily.      Allergies  Allergen Reactions  . Penicillins  Other (See Comments)    Unknown     The results of significant diagnostics from this hospitalization (including imaging, microbiology, ancillary and laboratory) are listed below for reference.    Significant Diagnostic Studies: CT ANGIO CHEST PE W OR WO CONTRAST  Result Date: 10/14/2019 CLINICAL DATA:  Positive for COVID. High pretest probability for pulmonary embolism. EXAM: CT ANGIOGRAPHY CHEST WITH CONTRAST TECHNIQUE: Multidetector CT imaging of the chest was performed using the standard protocol during bolus administration of intravenous contrast. Multiplanar CT image reconstructions and MIPs were obtained to evaluate the vascular anatomy. CONTRAST:  178mL OMNIPAQUE IOHEXOL 350 MG/ML SOLN COMPARISON:  None. FINDINGS: Cardiovascular: Normal heart size. No pericardial effusion. Extensive aortic and coronary atherosclerosis. There is irregular plaque along the aortic arch with a leftward projecting small aneurysm measuring 7  mm base to dome. Limited by motion artifact and bolus dispersion. No evidence of pulmonary embolism to the lobar/proximal segmental level. Mediastinum/Nodes: Negative for adenopathy or mass. Lungs/Pleura: Patchy ground-glass opacity throughout the bilateral lungs. Intervening unaffected lung has a normal appearance with no [employer]. No effusion or pneumothorax Upper Abdomen: Right adrenal mass. Suspect this is an adenoma, but not mentioned on abdominal CT report from 2001. Musculoskeletal: Spondylosis with bridging osteophytes. Bulky endplate spurring at the lower cervical spine with severe paring spinal stenosis on axial slices. No acute or aggressive finding. Review of the MIP images confirms the above findings. IMPRESSION: 1. Limited CTA with no evidence of pulmonary embolism. Diagnostic certainty significantly declines beyond the proximal segmental level. 2. Ground-glass pneumonia consistent with COVID-19 positivity. 3. Aortic Atherosclerosis (ICD10-I70.0) with a small  aneurysmal outpouching leftward from the aortic isthmus. 4. Spondyloarthropathy. Bulky endplate spurring in the lower cervical spine causes prominent spinal stenosis. 5. 2.4 cm right adrenal mass most often an adenoma in a patient without malignancy history. Electronically Signed   By: Monte Fantasia M.D.   On: 10/14/2019 11:47   DG Chest Port 1 View  Result Date: 10/11/2019 CLINICAL DATA:  Shortness of breath EXAM: PORTABLE CHEST 1 VIEW COMPARISON:  08/11/2005 FINDINGS: Mild cardiomegaly. Aortic atherosclerosis and tortuosity. Widespread hazy pulmonary infiltrates worrisome for viral pneumonia. The differential diagnosis is fluid overload/early congestive heart failure. No visible effusion. No acute bone finding. IMPRESSION: Widespread hazy pulmonary infiltrates worrisome for viral pneumonia. Differential diagnosis is fluid overload/CHF. Electronically Signed   By: Nelson Chimes M.D.   On: 10/11/2019 12:36    Microbiology: Recent Results (from the past 240 hour(s))  MRSA PCR Screening     Status: None   Collection Time: 10/15/19 12:43 PM   Specimen: Nasopharyngeal  Result Value Ref Range Status   MRSA by PCR NEGATIVE NEGATIVE Final    Comment:        The GeneXpert MRSA Assay (FDA approved for NASAL specimens only), is one component of a comprehensive MRSA colonization surveillance program. It is not intended to diagnose MRSA infection nor to guide or monitor treatment for MRSA infections. Performed at [facility], [place] [address] [place], [place] [address]      Labs: Basic Metabolic Panel: Recent Labs  Lab 10/18/19 0405 10/20/19 0334  NA 139 137  K 4.1 4.6  CL 104 101  CO2 27 26  GLUCOSE 222* 226*  BUN 36* 42*  CREATININE 1.07* 1.01*  CALCIUM 8.0* 8.2*   Liver Function Tests: No results for input(s): AST, ALT, ALKPHOS, BILITOT, PROT, ALBUMIN in the last 168 hours. No results for input(s): LIPASE, AMYLASE in the last 168 hours. No results for input(s): AMMONIA in  the last 168 hours. CBC: Recent Labs  Lab 10/18/19 0405 10/20/19 0334 10/21/19 0609  WBC 9.9 9.6 12.0*  NEUTROABS 8.8* 8.6* 10.4*  HGB 8.9* 8.9* 9.3*  HCT 30.0* 30.5* 30.7*  MCV 69.6* 70.6* 69.3*  PLT PLATELET CLUMPS NOTED ON SMEAR, UNABLE TO ESTIMATE 267 247   Cardiac Enzymes: No results for input(s): CKTOTAL, CKMB, CKMBINDEX, TROPONINI in the last 168 hours. BNP: BNP (last 3 results) Recent Labs    10/11/19 1200  BNP 76.4    ProBNP (last 3 results) No results for input(s): PROBNP in the last 8760 hours.  CBG: Recent Labs  Lab 10/23/19 1132 10/23/19 1600 10/23/19 2139 10/24/19 0516 10/24/19 0748  GLUCAP 182* 186* 177* 148* 156*    Principal Problem:   Acute hypoxemic  respiratory failure due to COVID-19 [facility]) Active Problems:   Morbid obesity ([place])   DM2 (diabetes mellitus, type 2) ([place])   Dyslipidemia   Hypertension   Diabetes mellitus without complication ([place])   Coronary artery disease   Iron deficiency anemia due to chronic blood loss   Time coordinating discharge: 38 minutes.  Signed:        Jaiveon Suppes, DO [facility]  10/24/2019, 8:48 AM

## 2019-10-24 NOTE — Progress Notes (Signed)
Pt prepared for d/c to SNF. IV d/c'd. Skin intact except as charted in most recent assessments. Vitals are stable. Report called to receiving facility. Pt to be transported by ambulance service. 

## 2019-10-25 DIAGNOSIS — J9601 Acute respiratory failure with hypoxia: Secondary | ICD-10-CM | POA: Diagnosis not present

## 2019-10-25 DIAGNOSIS — H409 Unspecified glaucoma: Secondary | ICD-10-CM | POA: Diagnosis not present

## 2019-10-25 DIAGNOSIS — Z743 Need for continuous supervision: Secondary | ICD-10-CM | POA: Diagnosis not present

## 2019-10-25 DIAGNOSIS — J1282 Pneumonia due to coronavirus disease 2019: Secondary | ICD-10-CM | POA: Diagnosis not present

## 2019-10-25 DIAGNOSIS — M6281 Muscle weakness (generalized): Secondary | ICD-10-CM | POA: Diagnosis not present

## 2019-10-25 DIAGNOSIS — D500 Iron deficiency anemia secondary to blood loss (chronic): Secondary | ICD-10-CM | POA: Diagnosis not present

## 2019-10-25 DIAGNOSIS — R0602 Shortness of breath: Secondary | ICD-10-CM | POA: Diagnosis not present

## 2019-10-25 DIAGNOSIS — U071 COVID-19: Secondary | ICD-10-CM | POA: Diagnosis not present

## 2019-10-25 DIAGNOSIS — E785 Hyperlipidemia, unspecified: Secondary | ICD-10-CM | POA: Diagnosis not present

## 2019-10-25 DIAGNOSIS — R279 Unspecified lack of coordination: Secondary | ICD-10-CM | POA: Diagnosis not present

## 2019-10-25 DIAGNOSIS — J4520 Mild intermittent asthma, uncomplicated: Secondary | ICD-10-CM | POA: Diagnosis not present

## 2019-10-25 DIAGNOSIS — J189 Pneumonia, unspecified organism: Secondary | ICD-10-CM | POA: Diagnosis not present

## 2019-10-25 DIAGNOSIS — E119 Type 2 diabetes mellitus without complications: Secondary | ICD-10-CM | POA: Diagnosis not present

## 2019-10-25 DIAGNOSIS — Z6841 Body Mass Index (BMI) 40.0 and over, adult: Secondary | ICD-10-CM | POA: Diagnosis not present

## 2019-10-25 DIAGNOSIS — I10 Essential (primary) hypertension: Secondary | ICD-10-CM | POA: Diagnosis not present

## 2019-10-25 DIAGNOSIS — I2510 Atherosclerotic heart disease of native coronary artery without angina pectoris: Secondary | ICD-10-CM | POA: Diagnosis not present

## 2019-10-25 LAB — GLUCOSE, CAPILLARY: Glucose-Capillary: 73 mg/dL (ref 70–99)

## 2019-10-25 NOTE — Progress Notes (Signed)
PROGRESS NOTE  Debra Acosta [id number] DOB: 1930/09/30 DOA: 10/11/2019 PCP: Lucianne Lei, MD  Brief History   86 year old with history of HTN, HLD, DM2, morbid obesity, CAD status post stent presented with shortness of breath. In the ER patient was noted to be significantly hypoxic requiring high flow nasal cannula, 60% on room air, COVID-19 screening test positive on 10/11/2019. Chest x-ray with bilateral pulmonary infiltrates. TRH asked to admit for COVID-19 viral pneumonia. Started on COVID-19 directed therapies. CTA PE negative for PE on 10/14/2019. Antibiotics started 1/30 as procalcitonin elevated (0.26) on admission.  The patient has been admitted to a telemetry bed. She is receiving IV solumedrol, and has completed a dose of remdesivir. The patient's mental status and compliance with care is greatly improved. Her respiratory has returned to baseline. She is now awating SNF placement. This is anticipated to take place tomorrow morning.  Consultants  . None  Procedures  . None  Antibiotics   Anti-infectives (From admission, onward)   Start     Dose/Rate Route Frequency Ordered Stop   10/15/19 1230  azithromycin (ZITHROMAX) 500 mg in sodium chloride 0.9 % 250 mL IVPB     500 mg 250 mL/hr over 60 Minutes Intravenous Every 24 hours 10/15/19 1143 10/17/19 1900   10/15/19 1230  cefTRIAXone (ROCEPHIN) 2 g in sodium chloride 0.9 % 100 mL IVPB     2 g 200 mL/hr over 30 Minutes Intravenous Every 24 hours 10/15/19 1143 10/21/19 1319   10/12/19 1000  remdesivir 100 mg in sodium chloride 0.9 % 100 mL IVPB     100 mg 200 mL/hr over 30 Minutes Intravenous Daily 10/11/19 1427 10/15/19 1145   10/11/19 1430  remdesivir 200 mg in sodium chloride 0.9% 250 mL IVPB     200 mg 580 mL/hr over 30 Minutes Intravenous Once 10/11/19 1427 10/11/19 1703     Subjective  The patient is lying quietly. No new complaints.   Objective   Vitals:  Vitals:   10/24/19 1948 10/25/19 0417  BP:  102/87 111/61  Pulse: 75 67  Resp: 20 16  Temp: 97.7 F (36.5 C) 97.8 F (36.6 C)  SpO2: 90% 92%   Exam:  Constitutional:  . The patient is awake, alert, and oriented x 3. No acute distress. Respiratory:  . No increased work of breathing. . No wheezes, rales, or rhonchi . No tactile fremitus Cardiovascular:  . Regular rate and rhythm . No murmurs, ectopy, or gallups. . No lateral PMI. No thrills. Abdomen:  . Abdomen is soft, non-tender, non-distended . Morbidly obese. . No hernias, masses, or organomegaly . Bowel sounds are distant.  Musculoskeletal:  . No cyanosis, clubbing, or edema Skin:  . No rashes, lesions, ulcers . palpation of skin: no induration or nodules Neurologic:  . CN 2-12 intact . Sensation all 4 extremities intact Psychiatric:  . Mental status: Improved.  I have personally reviewed the following:   Today's Data  . Vitals, CBC  Scheduled Meds: . vitamin C  500 mg Oral Daily  . aspirin EC  81 mg Oral Daily  . brimonidine  1 drop Both Eyes TID  . cholecalciferol  1,000 Units Oral Daily  . clopidogrel  75 mg Oral Daily  . dorzolamide  1 drop Both Eyes TID  . enoxaparin (LOVENOX) injection  65 mg Subcutaneous Q24H  . ferrous sulfate  325 mg Oral TID WC  . insulin aspart  0-15 Units Subcutaneous TID WC  . insulin detemir  15 Units Subcutaneous  BID  . latanoprost  1 drop Both Eyes QHS  . linagliptin  5 mg Oral Daily  . mouth rinse  15 mL Mouth Rinse BID  . methylPREDNISolone (SOLU-MEDROL) injection  40 mg Intravenous Q8H  . mometasone-formoterol  2 puff Inhalation BID  . montelukast  10 mg Oral QHS  . sodium chloride flush  3 mL Intravenous Q12H  . zinc sulfate  220 mg Oral Daily   Continuous Infusions: . sodium chloride      Principal Problem:   Acute hypoxemic respiratory failure due to COVID-19 [facility]) Active Problems:   Morbid obesity ([place])   DM2 (diabetes mellitus, type 2) ([place])   Dyslipidemia   Hypertension   Diabetes mellitus  without complication ([place])   Coronary artery disease   Iron deficiency anemia due to chronic blood loss   LOS: 14 days   A & P  Acute hypoxic respiratory failure down to 60% on room air secondary to COVID-19 viral pneumonia with concern for superimposed bacterial pneumonia given elevated procalcitonin: The patient has completed Remdesivir and Actemra. She continues to receive IV solumedrol and will continue on it for 2 more days. However, her oxygen saturations dropped to 60% on room air. She is now saturating at 97%. Continue incentive spirometer and flutter valve and Lasix. She is receiving Tussionex, and Vitamin C &Zinc, vitamin D3. As her procalcitonin is elevated she is now getting Azithromycin and ceftriaxone for possible superinfection. MRSA by PCR is negative. Urine antigen for strep pneumo is negative. Inflammatory markers are trending down. Continue Tussionex twice daily x3 days. Monitor.  Essential hypertension: Blood pressures are more normotensive than low, but without antihypertensives after bolus. Monitor.  Diabetes mellitus type 2 with hyperglycemia: The patient's glucoses are elevated due to IV Steroids. She is now receiving Levemir 12 units bid with SSI and linagliptin. Appreciate diabetes coordinator assistance. Hemoglobin A1c 6.6 on 10/11/2019. FSBS has run 170-189 for the last 24 hours. I have increased levemir to 15 unts bid. [place].   Coronary artery disease: Noted and stable. Pt is being monitored on telemetry. No chest pain. Continue ASA and Plavix.  Iron deficiency anemia: Ferritin is low, on iron supplement, continue to monitor Hgb. Stable. Recommend outpatient evaluation with colonoscopy, consideration of EGD  History of COPD and asthma: Continue home bronchodilators. Maintain O2 saturation greater than 92%.  Morbid obesity with BMI greater than 45: Complicates all cares and increases risk of poor outcome of COVID-19.   History of glaucoma: Continue home  meds.  Adrenal adenoma:  Consider evaluation for overproduction of hormones as an outpatient.  I have seen and examined myself. I have spent 32 minutes in her evaluation and care.  DVT prophylaxis: Lovenox SQ Code Status: DNR  Family Communication: None available Disposition Plan: Discharge to SNF when bed is available.  Isel Skufca, DO [facility] Direct contact: see [email or web address]  7PM-7AM contact night coverage as above 10/24/2019, 3:53 PM  LOS: 6 days

## 2019-10-25 NOTE — Discharge Summary (Signed)
Physician Discharge Summary  Debra Acosta [id number] DOB: 07/01/1931 DOA: 10/11/2019  PCP: Lucianne Lei, MD  Admit date: 10/11/2019 Discharge date: 10/25/2019  Recommendations for Outpatient Follow-up:  Discharge to SNF with PT/OT Follow up with PCP in 7-10 days Contact information for after-discharge care    Destination    [facility] .   Service: Skilled Nursing Contact information: [address] [place] [phone]             Discharge Diagnoses: Principal diagnosis is #1 1. Acute hypoxic respiratory failure 2. Essential hypertension 3. DM II 4. CAD 5. Fe deficiency anemia 6. COPD and asthma 7. Morbid obesity 8. Adrenal adenoma 9. History of glaucoma  Discharge Condition: Fair  Disposition: SNF  Diet recommendation: Heart healthy with modified carbohydrates  Filed Weights   10/11/19 1135  Weight: 129.3 kg    History of present illness:  Debra Acosta is a 86 y.o. female with medical history significant of HTN; HLD; DM; morbid obesity (BMI 47); and CAD s/p stents presenting with SOB.  She lives with her son, who moves furniture.  I spoke with her older son: They ate on Sunday and it took her longer to get around than usual.   He was concerned about this and thinks that she got the virus from her younger son last week.  The patient reports that she has been short-winded for about a week.  She went to her PCP today and was told to come to the ER for further evaluation.  She reports that she "just has a bronchitis or something."  She denies URI symptoms other than SOB.  She thinks she may have lost her sense of taste.  She has noticed some chest discomfort with the SOB.  No GI symptoms.  Hospital Course:  86 year old with history of HTN, HLD, DM2, morbid obesity, CAD status post stent presented with shortness of breath. In the ER patient was noted to be significantly hypoxic requiring high flow nasal  cannula, 60% on room air, COVID-19 screening test positive on 10/11/2019. Chest x-ray with bilateral pulmonary infiltrates. TRH asked to admit for COVID-19 viral pneumonia. Started on COVID-19 directed therapies. CTA PE negative for PE on 10/14/2019. Antibiotics started 1/30 as procalcitonin elevated (0.26) on admission.  The patient has been admitted to a telemetry bed. She is receiving IV solumedrol, and has completed a dose of remdesivir. The patient's mental status and compliance with care is greatly improved. Her respiratory has returned to baseline. She is now awating SNF placement.  Today's assessment: S: The patient is resting comfortably. No new complaints. O: Vitals:  Vitals:   10/24/19 1948 10/25/19 0417  BP: 102/87 111/61  Pulse: 75 67  Resp: 20 16  Temp: 97.7 F (36.5 C) 97.8 F (36.6 C)  SpO2: 90% 92%    Exam:  Exam:  Constitutional:   The patient is awake, alert, and oriented x 3. No acute distress. Respiratory:   No increased work of breathing.  No wheezes, rales, or rhonchi  No tactile fremitus Cardiovascular:   Regular rate and rhythm  No murmurs, ectopy, or gallups.  No lateral PMI. No thrills. Abdomen:   Abdomen is soft, non-tender, non-distended  Morbidly obese.  No hernias, masses, or organomegaly  Bowel sounds are distant.  Musculoskeletal:   No cyanosis, clubbing, or edema Skin:   No rashes, lesions, ulcers  palpation of skin: no induration or nodules Neurologic:   CN 2-12 intact  Sensation  all 4 extremities intact Psychiatric:  Mental status: Improved.  Discharge Instructions  Discharge Instructions    Activity as tolerated - No restrictions   Complete by: As directed    Call MD for:  difficulty breathing, headache or visual disturbances   Complete by: As directed    Diet - low sodium heart healthy   Complete by: As directed    Diet Carb Modified   Complete by: As directed    Discharge instructions   Complete  by: As directed    Discharge to SNF with PT/OT Follow up with PCP in 7-10 days   Increase activity slowly   Complete by: As directed      Allergies as of 10/25/2019      Reactions   Penicillins Other (See Comments)   Unknown      Medication List    STOP taking these medications   irbesartan 150 MG tablet Commonly known as: AVAPRO     TAKE these medications   albuterol 108 (90 Base) MCG/ACT inhaler Commonly known as: VENTOLIN HFA Inhale 2 puffs into the lungs every 6 (six) hours.   ascorbic acid 500 MG tablet Commonly known as: VITAMIN C Take 1 tablet (500 mg total) by mouth daily.   aspirin EC 81 MG tablet Take 81 mg by mouth daily.   brimonidine 0.1 % Soln Commonly known as: ALPHAGAN P Place 1 drop into both eyes 3 (three) times daily.   clopidogrel 75 MG tablet Commonly known as: PLAVIX Take 75 mg by mouth daily.   docusate sodium 100 MG capsule Commonly known as: COLACE Take 1 capsule (100 mg total) by mouth every 12 (twelve) hours. What changed:   when to take this  reasons to take this   dorzolamide 2 % ophthalmic solution Commonly known as: TRUSOPT Place 1 drop into both eyes 3 (three) times daily.   ferrous sulfate 325 (65 FE) MG tablet Take 1 tablet (325 mg total) by mouth daily with breakfast.   Fluticasone-Salmeterol 250-50 MCG/DOSE Aepb Commonly known as: ADVAIR Inhale 1 puff into the lungs daily.   ipratropium 17 MCG/ACT inhaler Commonly known as: ATROVENT HFA Inhale 2 puffs into the lungs every 6 (six) hours.   latanoprost 0.005 % ophthalmic solution Commonly known as: XALATAN Place 1 drop into both eyes at bedtime.   metFORMIN 500 MG tablet Commonly known as: GLUCOPHAGE Take 1,000 mg by mouth at bedtime.   montelukast 10 MG tablet Commonly known as: SINGULAIR Take 10 mg by mouth at bedtime.   nitroGLYCERIN 0.4 MG SL tablet Commonly known as: NITROSTAT Place 0.4 mg under the tongue every 5 (five) minutes as needed for chest  pain.   Vitamin D3 25 MCG tablet Commonly known as: Vitamin D Take 1 tablet (1,000 Units total) by mouth daily.   zinc sulfate 220 (50 Zn) MG capsule Take 1 capsule (220 mg total) by mouth daily.      Allergies  Allergen Reactions  . Penicillins Other (See Comments)    Unknown     The results of significant diagnostics from this hospitalization (including imaging, microbiology, ancillary and laboratory) are listed below for reference.    Significant Diagnostic Studies: CT ANGIO CHEST PE W OR WO CONTRAST  Result Date: 10/14/2019 CLINICAL DATA:  Positive for COVID. High pretest probability for pulmonary embolism. EXAM: CT ANGIOGRAPHY CHEST WITH CONTRAST TECHNIQUE: Multidetector CT imaging of the chest was performed using the standard protocol during bolus administration of intravenous contrast. Multiplanar CT image reconstructions and MIPs  were obtained to evaluate the vascular anatomy. CONTRAST:  138mL OMNIPAQUE IOHEXOL 350 MG/ML SOLN COMPARISON:  None. FINDINGS: Cardiovascular: Normal heart size. No pericardial effusion. Extensive aortic and coronary atherosclerosis. There is irregular plaque along the aortic arch with a leftward projecting small aneurysm measuring 7 mm base to dome. Limited by motion artifact and bolus dispersion. No evidence of pulmonary embolism to the lobar/proximal segmental level. Mediastinum/Nodes: Negative for adenopathy or mass. Lungs/Pleura: Patchy ground-glass opacity throughout the bilateral lungs. Intervening unaffected lung has a normal appearance with no [employer]. No effusion or pneumothorax Upper Abdomen: Right adrenal mass. Suspect this is an adenoma, but not mentioned on abdominal CT report from 2001. Musculoskeletal: Spondylosis with bridging osteophytes. Bulky endplate spurring at the lower cervical spine with severe paring spinal stenosis on axial slices. No acute or aggressive finding. Review of the MIP images confirms the above findings. IMPRESSION:  1. Limited CTA with no evidence of pulmonary embolism. Diagnostic certainty significantly declines beyond the proximal segmental level. 2. Ground-glass pneumonia consistent with COVID-19 positivity. 3. Aortic Atherosclerosis (ICD10-I70.0) with a small aneurysmal outpouching leftward from the aortic isthmus. 4. Spondyloarthropathy. Bulky endplate spurring in the lower cervical spine causes prominent spinal stenosis. 5. 2.4 cm right adrenal mass most often an adenoma in a patient without malignancy history. Electronically Signed   By: Monte Fantasia M.D.   On: 10/14/2019 11:47   DG Chest Port 1 View  Result Date: 10/11/2019 CLINICAL DATA:  Shortness of breath EXAM: PORTABLE CHEST 1 VIEW COMPARISON:  08/11/2005 FINDINGS: Mild cardiomegaly. Aortic atherosclerosis and tortuosity. Widespread hazy pulmonary infiltrates worrisome for viral pneumonia. The differential diagnosis is fluid overload/early congestive heart failure. No visible effusion. No acute bone finding. IMPRESSION: Widespread hazy pulmonary infiltrates worrisome for viral pneumonia. Differential diagnosis is fluid overload/CHF. Electronically Signed   By: Nelson Chimes M.D.   On: 10/11/2019 12:36    Microbiology: Recent Results (from the past 240 hour(s))  MRSA PCR Screening     Status: None   Collection Time: 10/15/19 12:43 PM   Specimen: Nasopharyngeal  Result Value Ref Range Status   MRSA by PCR NEGATIVE NEGATIVE Final    Comment:        The GeneXpert MRSA Assay (FDA approved for NASAL specimens only), is one component of a comprehensive MRSA colonization surveillance program. It is not intended to diagnose MRSA infection nor to guide or monitor treatment for MRSA infections. Performed at [facility], [place] [address], [place], [place] [address]      Labs: Basic Metabolic Panel: Recent Labs  Lab 10/20/19 0334  NA 137  K 4.6  CL 101  CO2 26  GLUCOSE 226*  BUN 42*  CREATININE 1.01*  CALCIUM 8.2*   Liver  Function Tests: No results for input(s): AST, ALT, ALKPHOS, BILITOT, PROT, ALBUMIN in the last 168 hours. No results for input(s): LIPASE, AMYLASE in the last 168 hours. No results for input(s): AMMONIA in the last 168 hours. CBC: Recent Labs  Lab 10/20/19 0334 10/21/19 0609  WBC 9.6 12.0*  NEUTROABS 8.6* 10.4*  HGB 8.9* 9.3*  HCT 30.5* 30.7*  MCV 70.6* 69.3*  PLT 267 247   Cardiac Enzymes: No results for input(s): CKTOTAL, CKMB, CKMBINDEX, TROPONINI in the last 168 hours. BNP: BNP (last 3 results) Recent Labs    10/11/19 1200  BNP 76.4    ProBNP (last 3 results) No results for input(s): PROBNP in the last 8760 hours.  CBG: Recent Labs  Lab 10/24/19 0748 10/24/19 1241  10/24/19 1712 10/24/19 2044 10/25/19 0745  GLUCAP 156* 174* 157* 142* 73    Principal Problem:   Acute hypoxemic respiratory failure due to COVID-19 [facility]) Active Problems:   Morbid obesity ([place])   DM2 (diabetes mellitus, type 2) ([place])   Dyslipidemia   Hypertension   Diabetes mellitus without complication ([place])   Coronary artery disease   Iron deficiency anemia due to chronic blood loss   Time coordinating discharge: 38 minutes.  Signed:        Calistro Rauf, DO [facility]  10/25/2019, 10:40 AM

## 2019-10-25 NOTE — Discharge Instructions (Signed)
Stop taking irbesartan until follow up on blood pressure with your Primary Doctor (Blood pressures normal without irbesartan in the hospital)

## 2019-10-28 DIAGNOSIS — U071 COVID-19: Secondary | ICD-10-CM | POA: Diagnosis not present

## 2019-10-28 DIAGNOSIS — J4520 Mild intermittent asthma, uncomplicated: Secondary | ICD-10-CM | POA: Diagnosis not present

## 2019-10-28 DIAGNOSIS — I2510 Atherosclerotic heart disease of native coronary artery without angina pectoris: Secondary | ICD-10-CM | POA: Diagnosis not present

## 2019-10-28 DIAGNOSIS — E785 Hyperlipidemia, unspecified: Secondary | ICD-10-CM | POA: Diagnosis not present

## 2019-11-07 DIAGNOSIS — I2510 Atherosclerotic heart disease of native coronary artery without angina pectoris: Secondary | ICD-10-CM | POA: Diagnosis not present

## 2019-11-07 DIAGNOSIS — E785 Hyperlipidemia, unspecified: Secondary | ICD-10-CM | POA: Diagnosis not present

## 2019-11-07 DIAGNOSIS — U071 COVID-19: Secondary | ICD-10-CM | POA: Diagnosis not present

## 2019-11-07 DIAGNOSIS — H409 Unspecified glaucoma: Secondary | ICD-10-CM | POA: Diagnosis not present

## 2019-11-09 DIAGNOSIS — D509 Iron deficiency anemia, unspecified: Secondary | ICD-10-CM | POA: Diagnosis not present

## 2019-11-09 DIAGNOSIS — I509 Heart failure, unspecified: Secondary | ICD-10-CM | POA: Diagnosis not present

## 2019-11-09 DIAGNOSIS — I7300 Raynaud's syndrome without gangrene: Secondary | ICD-10-CM | POA: Diagnosis not present

## 2019-11-09 DIAGNOSIS — U071 COVID-19: Secondary | ICD-10-CM | POA: Diagnosis not present

## 2019-11-09 DIAGNOSIS — I2510 Atherosclerotic heart disease of native coronary artery without angina pectoris: Secondary | ICD-10-CM | POA: Diagnosis not present

## 2019-11-09 DIAGNOSIS — E785 Hyperlipidemia, unspecified: Secondary | ICD-10-CM | POA: Diagnosis not present

## 2019-11-09 DIAGNOSIS — K219 Gastro-esophageal reflux disease without esophagitis: Secondary | ICD-10-CM | POA: Diagnosis not present

## 2019-11-09 DIAGNOSIS — J440 Chronic obstructive pulmonary disease with acute lower respiratory infection: Secondary | ICD-10-CM | POA: Diagnosis not present

## 2019-11-09 DIAGNOSIS — J188 Other pneumonia, unspecified organism: Secondary | ICD-10-CM | POA: Diagnosis not present

## 2019-11-09 DIAGNOSIS — E119 Type 2 diabetes mellitus without complications: Secondary | ICD-10-CM | POA: Diagnosis not present

## 2019-11-09 DIAGNOSIS — J4520 Mild intermittent asthma, uncomplicated: Secondary | ICD-10-CM | POA: Diagnosis not present

## 2019-11-09 DIAGNOSIS — I110 Hypertensive heart disease with heart failure: Secondary | ICD-10-CM | POA: Diagnosis not present

## 2019-11-14 DIAGNOSIS — K219 Gastro-esophageal reflux disease without esophagitis: Secondary | ICD-10-CM | POA: Diagnosis not present

## 2019-11-14 DIAGNOSIS — I509 Heart failure, unspecified: Secondary | ICD-10-CM | POA: Diagnosis not present

## 2019-11-14 DIAGNOSIS — I2510 Atherosclerotic heart disease of native coronary artery without angina pectoris: Secondary | ICD-10-CM | POA: Diagnosis not present

## 2019-11-14 DIAGNOSIS — E785 Hyperlipidemia, unspecified: Secondary | ICD-10-CM | POA: Diagnosis not present

## 2019-11-14 DIAGNOSIS — U071 COVID-19: Secondary | ICD-10-CM | POA: Diagnosis not present

## 2019-11-14 DIAGNOSIS — D509 Iron deficiency anemia, unspecified: Secondary | ICD-10-CM | POA: Diagnosis not present

## 2019-11-14 DIAGNOSIS — I7300 Raynaud's syndrome without gangrene: Secondary | ICD-10-CM | POA: Diagnosis not present

## 2019-11-14 DIAGNOSIS — E119 Type 2 diabetes mellitus without complications: Secondary | ICD-10-CM | POA: Diagnosis not present

## 2019-11-14 DIAGNOSIS — J4520 Mild intermittent asthma, uncomplicated: Secondary | ICD-10-CM | POA: Diagnosis not present

## 2019-11-14 DIAGNOSIS — J188 Other pneumonia, unspecified organism: Secondary | ICD-10-CM | POA: Diagnosis not present

## 2019-11-14 DIAGNOSIS — I110 Hypertensive heart disease with heart failure: Secondary | ICD-10-CM | POA: Diagnosis not present

## 2019-11-14 DIAGNOSIS — J440 Chronic obstructive pulmonary disease with acute lower respiratory infection: Secondary | ICD-10-CM | POA: Diagnosis not present

## 2019-11-15 DIAGNOSIS — U071 COVID-19: Secondary | ICD-10-CM | POA: Diagnosis not present

## 2019-11-15 DIAGNOSIS — I2510 Atherosclerotic heart disease of native coronary artery without angina pectoris: Secondary | ICD-10-CM | POA: Diagnosis not present

## 2019-11-16 ENCOUNTER — Ambulatory Visit: Payer: PPO | Attending: Internal Medicine

## 2019-11-16 ENCOUNTER — Other Ambulatory Visit: Payer: Self-pay

## 2019-11-16 DIAGNOSIS — Z20822 Contact with and (suspected) exposure to covid-19: Secondary | ICD-10-CM

## 2019-11-17 LAB — NOVEL CORONAVIRUS, NAA: SARS-CoV-2, NAA: NOT DETECTED

## 2019-11-18 DIAGNOSIS — J440 Chronic obstructive pulmonary disease with acute lower respiratory infection: Secondary | ICD-10-CM | POA: Diagnosis not present

## 2019-11-18 DIAGNOSIS — U071 COVID-19: Secondary | ICD-10-CM | POA: Diagnosis not present

## 2019-11-18 DIAGNOSIS — J188 Other pneumonia, unspecified organism: Secondary | ICD-10-CM | POA: Diagnosis not present

## 2019-11-18 DIAGNOSIS — J4520 Mild intermittent asthma, uncomplicated: Secondary | ICD-10-CM | POA: Diagnosis not present

## 2019-11-19 ENCOUNTER — Telehealth: Payer: Self-pay

## 2019-11-19 NOTE — Telephone Encounter (Signed)
Called and informed patient that test for Covid 19 was NEGATIVE. Discussed signs and symptoms of Covid 19 : fever, chills, respiratory symptoms, cough, ENT symptoms, sore throat, SOB, muscle pain, diarrhea, headache, loss of taste/smell, close exposure to COVID-19 patient. Pt instructed to call PCP if they develop the above signs and sx. Pt also instructed to call 911 if having respiratory issues/distress. . Pt verbalized understanding. Spoke with pt's son.

## 2019-11-29 DIAGNOSIS — J440 Chronic obstructive pulmonary disease with acute lower respiratory infection: Secondary | ICD-10-CM | POA: Diagnosis not present

## 2019-11-29 DIAGNOSIS — J449 Chronic obstructive pulmonary disease, unspecified: Secondary | ICD-10-CM | POA: Diagnosis not present

## 2019-11-29 DIAGNOSIS — R634 Abnormal weight loss: Secondary | ICD-10-CM | POA: Diagnosis not present

## 2019-11-29 DIAGNOSIS — R0902 Hypoxemia: Secondary | ICD-10-CM | POA: Diagnosis not present

## 2019-11-29 DIAGNOSIS — U071 COVID-19: Secondary | ICD-10-CM | POA: Diagnosis not present

## 2019-11-29 DIAGNOSIS — I10 Essential (primary) hypertension: Secondary | ICD-10-CM | POA: Diagnosis not present

## 2019-11-29 DIAGNOSIS — E1169 Type 2 diabetes mellitus with other specified complication: Secondary | ICD-10-CM | POA: Diagnosis not present

## 2019-11-29 DIAGNOSIS — M130 Polyarthritis, unspecified: Secondary | ICD-10-CM | POA: Diagnosis not present

## 2019-12-15 DIAGNOSIS — I509 Heart failure, unspecified: Secondary | ICD-10-CM | POA: Diagnosis not present

## 2019-12-15 DIAGNOSIS — E119 Type 2 diabetes mellitus without complications: Secondary | ICD-10-CM | POA: Diagnosis not present

## 2019-12-15 DIAGNOSIS — I2510 Atherosclerotic heart disease of native coronary artery without angina pectoris: Secondary | ICD-10-CM | POA: Diagnosis not present

## 2019-12-15 DIAGNOSIS — D509 Iron deficiency anemia, unspecified: Secondary | ICD-10-CM | POA: Diagnosis not present

## 2019-12-15 DIAGNOSIS — J4520 Mild intermittent asthma, uncomplicated: Secondary | ICD-10-CM | POA: Diagnosis not present

## 2019-12-15 DIAGNOSIS — U071 COVID-19: Secondary | ICD-10-CM | POA: Diagnosis not present

## 2019-12-15 DIAGNOSIS — K219 Gastro-esophageal reflux disease without esophagitis: Secondary | ICD-10-CM | POA: Diagnosis not present

## 2019-12-15 DIAGNOSIS — I110 Hypertensive heart disease with heart failure: Secondary | ICD-10-CM | POA: Diagnosis not present

## 2019-12-15 DIAGNOSIS — I7300 Raynaud's syndrome without gangrene: Secondary | ICD-10-CM | POA: Diagnosis not present

## 2019-12-15 DIAGNOSIS — J188 Other pneumonia, unspecified organism: Secondary | ICD-10-CM | POA: Diagnosis not present

## 2019-12-15 DIAGNOSIS — J440 Chronic obstructive pulmonary disease with acute lower respiratory infection: Secondary | ICD-10-CM | POA: Diagnosis not present

## 2019-12-15 DIAGNOSIS — E785 Hyperlipidemia, unspecified: Secondary | ICD-10-CM | POA: Diagnosis not present

## 2019-12-19 DIAGNOSIS — J449 Chronic obstructive pulmonary disease, unspecified: Secondary | ICD-10-CM | POA: Diagnosis not present

## 2019-12-19 DIAGNOSIS — I10 Essential (primary) hypertension: Secondary | ICD-10-CM | POA: Diagnosis not present

## 2019-12-19 DIAGNOSIS — J9691 Respiratory failure, unspecified with hypoxia: Secondary | ICD-10-CM | POA: Diagnosis not present

## 2019-12-19 DIAGNOSIS — F064 Anxiety disorder due to known physiological condition: Secondary | ICD-10-CM | POA: Diagnosis not present

## 2020-01-08 DIAGNOSIS — J9691 Respiratory failure, unspecified with hypoxia: Secondary | ICD-10-CM | POA: Diagnosis not present

## 2020-01-08 DIAGNOSIS — I110 Hypertensive heart disease with heart failure: Secondary | ICD-10-CM | POA: Diagnosis not present

## 2020-01-08 DIAGNOSIS — I2510 Atherosclerotic heart disease of native coronary artery without angina pectoris: Secondary | ICD-10-CM | POA: Diagnosis not present

## 2020-01-08 DIAGNOSIS — E119 Type 2 diabetes mellitus without complications: Secondary | ICD-10-CM | POA: Diagnosis not present

## 2020-01-08 DIAGNOSIS — E785 Hyperlipidemia, unspecified: Secondary | ICD-10-CM | POA: Diagnosis not present

## 2020-01-08 DIAGNOSIS — Z8616 Personal history of COVID-19: Secondary | ICD-10-CM | POA: Diagnosis not present

## 2020-01-08 DIAGNOSIS — D509 Iron deficiency anemia, unspecified: Secondary | ICD-10-CM | POA: Diagnosis not present

## 2020-01-08 DIAGNOSIS — J4520 Mild intermittent asthma, uncomplicated: Secondary | ICD-10-CM | POA: Diagnosis not present

## 2020-01-08 DIAGNOSIS — I509 Heart failure, unspecified: Secondary | ICD-10-CM | POA: Diagnosis not present

## 2020-01-08 DIAGNOSIS — J449 Chronic obstructive pulmonary disease, unspecified: Secondary | ICD-10-CM | POA: Diagnosis not present

## 2020-01-08 DIAGNOSIS — Z8701 Personal history of pneumonia (recurrent): Secondary | ICD-10-CM | POA: Diagnosis not present

## 2020-01-08 DIAGNOSIS — K219 Gastro-esophageal reflux disease without esophagitis: Secondary | ICD-10-CM | POA: Diagnosis not present

## 2020-01-08 DIAGNOSIS — I7300 Raynaud's syndrome without gangrene: Secondary | ICD-10-CM | POA: Diagnosis not present

## 2020-01-13 DIAGNOSIS — J449 Chronic obstructive pulmonary disease, unspecified: Secondary | ICD-10-CM | POA: Diagnosis not present

## 2020-01-13 DIAGNOSIS — E119 Type 2 diabetes mellitus without complications: Secondary | ICD-10-CM | POA: Diagnosis not present

## 2020-01-13 DIAGNOSIS — J4520 Mild intermittent asthma, uncomplicated: Secondary | ICD-10-CM | POA: Diagnosis not present

## 2020-01-13 DIAGNOSIS — I10 Essential (primary) hypertension: Secondary | ICD-10-CM | POA: Diagnosis not present

## 2020-01-14 DIAGNOSIS — U071 COVID-19: Secondary | ICD-10-CM | POA: Diagnosis not present

## 2020-01-14 DIAGNOSIS — I7300 Raynaud's syndrome without gangrene: Secondary | ICD-10-CM | POA: Diagnosis not present

## 2020-01-14 DIAGNOSIS — J4520 Mild intermittent asthma, uncomplicated: Secondary | ICD-10-CM | POA: Diagnosis not present

## 2020-01-14 DIAGNOSIS — K219 Gastro-esophageal reflux disease without esophagitis: Secondary | ICD-10-CM | POA: Diagnosis not present

## 2020-01-14 DIAGNOSIS — I509 Heart failure, unspecified: Secondary | ICD-10-CM | POA: Diagnosis not present

## 2020-01-14 DIAGNOSIS — Z8701 Personal history of pneumonia (recurrent): Secondary | ICD-10-CM | POA: Diagnosis not present

## 2020-01-14 DIAGNOSIS — I110 Hypertensive heart disease with heart failure: Secondary | ICD-10-CM | POA: Diagnosis not present

## 2020-01-14 DIAGNOSIS — E785 Hyperlipidemia, unspecified: Secondary | ICD-10-CM | POA: Diagnosis not present

## 2020-01-14 DIAGNOSIS — I2510 Atherosclerotic heart disease of native coronary artery without angina pectoris: Secondary | ICD-10-CM | POA: Diagnosis not present

## 2020-01-14 DIAGNOSIS — D509 Iron deficiency anemia, unspecified: Secondary | ICD-10-CM | POA: Diagnosis not present

## 2020-01-14 DIAGNOSIS — Z8616 Personal history of COVID-19: Secondary | ICD-10-CM | POA: Diagnosis not present

## 2020-01-14 DIAGNOSIS — E119 Type 2 diabetes mellitus without complications: Secondary | ICD-10-CM | POA: Diagnosis not present

## 2020-01-14 DIAGNOSIS — J449 Chronic obstructive pulmonary disease, unspecified: Secondary | ICD-10-CM | POA: Diagnosis not present

## 2020-01-14 DIAGNOSIS — J9691 Respiratory failure, unspecified with hypoxia: Secondary | ICD-10-CM | POA: Diagnosis not present

## 2020-01-23 DIAGNOSIS — N3281 Overactive bladder: Secondary | ICD-10-CM | POA: Diagnosis not present

## 2020-01-23 DIAGNOSIS — I119 Hypertensive heart disease without heart failure: Secondary | ICD-10-CM | POA: Diagnosis not present

## 2020-01-23 DIAGNOSIS — M130 Polyarthritis, unspecified: Secondary | ICD-10-CM | POA: Diagnosis not present

## 2020-02-15 DIAGNOSIS — I2510 Atherosclerotic heart disease of native coronary artery without angina pectoris: Secondary | ICD-10-CM | POA: Diagnosis not present

## 2020-02-15 DIAGNOSIS — U071 COVID-19: Secondary | ICD-10-CM | POA: Diagnosis not present

## 2020-03-05 DIAGNOSIS — I119 Hypertensive heart disease without heart failure: Secondary | ICD-10-CM | POA: Diagnosis not present

## 2020-03-05 DIAGNOSIS — N3281 Overactive bladder: Secondary | ICD-10-CM | POA: Diagnosis not present

## 2020-03-05 DIAGNOSIS — M130 Polyarthritis, unspecified: Secondary | ICD-10-CM | POA: Diagnosis not present

## 2020-03-05 DIAGNOSIS — E1169 Type 2 diabetes mellitus with other specified complication: Secondary | ICD-10-CM | POA: Diagnosis not present

## 2020-03-05 DIAGNOSIS — J440 Chronic obstructive pulmonary disease with acute lower respiratory infection: Secondary | ICD-10-CM | POA: Diagnosis not present

## 2020-03-05 DIAGNOSIS — I110 Hypertensive heart disease with heart failure: Secondary | ICD-10-CM | POA: Diagnosis not present

## 2020-03-05 DIAGNOSIS — J449 Chronic obstructive pulmonary disease, unspecified: Secondary | ICD-10-CM | POA: Diagnosis not present

## 2020-03-16 DIAGNOSIS — U071 COVID-19: Secondary | ICD-10-CM | POA: Diagnosis not present

## 2020-03-16 DIAGNOSIS — I2510 Atherosclerotic heart disease of native coronary artery without angina pectoris: Secondary | ICD-10-CM | POA: Diagnosis not present

## 2020-04-03 DIAGNOSIS — J4520 Mild intermittent asthma, uncomplicated: Secondary | ICD-10-CM | POA: Diagnosis not present

## 2020-04-03 DIAGNOSIS — Z8616 Personal history of COVID-19: Secondary | ICD-10-CM | POA: Diagnosis not present

## 2020-04-03 DIAGNOSIS — Z8701 Personal history of pneumonia (recurrent): Secondary | ICD-10-CM | POA: Diagnosis not present

## 2020-04-03 DIAGNOSIS — J449 Chronic obstructive pulmonary disease, unspecified: Secondary | ICD-10-CM | POA: Diagnosis not present

## 2020-04-16 DIAGNOSIS — I2510 Atherosclerotic heart disease of native coronary artery without angina pectoris: Secondary | ICD-10-CM | POA: Diagnosis not present

## 2020-04-16 DIAGNOSIS — U071 COVID-19: Secondary | ICD-10-CM | POA: Diagnosis not present

## 2020-04-23 DIAGNOSIS — M130 Polyarthritis, unspecified: Secondary | ICD-10-CM | POA: Diagnosis not present

## 2020-04-23 DIAGNOSIS — J449 Chronic obstructive pulmonary disease, unspecified: Secondary | ICD-10-CM | POA: Diagnosis not present

## 2020-04-23 DIAGNOSIS — E1169 Type 2 diabetes mellitus with other specified complication: Secondary | ICD-10-CM | POA: Diagnosis not present

## 2020-04-25 DIAGNOSIS — H35412 Lattice degeneration of retina, left eye: Secondary | ICD-10-CM | POA: Diagnosis not present

## 2020-04-25 DIAGNOSIS — H33002 Unspecified retinal detachment with retinal break, left eye: Secondary | ICD-10-CM | POA: Diagnosis not present

## 2020-04-25 DIAGNOSIS — H02831 Dermatochalasis of right upper eyelid: Secondary | ICD-10-CM | POA: Diagnosis not present

## 2020-04-25 DIAGNOSIS — Z961 Presence of intraocular lens: Secondary | ICD-10-CM | POA: Diagnosis not present

## 2020-04-25 DIAGNOSIS — H35372 Puckering of macula, left eye: Secondary | ICD-10-CM | POA: Diagnosis not present

## 2020-04-25 DIAGNOSIS — H02834 Dermatochalasis of left upper eyelid: Secondary | ICD-10-CM | POA: Diagnosis not present

## 2020-04-25 DIAGNOSIS — E119 Type 2 diabetes mellitus without complications: Secondary | ICD-10-CM | POA: Diagnosis not present

## 2020-04-25 DIAGNOSIS — H401131 Primary open-angle glaucoma, bilateral, mild stage: Secondary | ICD-10-CM | POA: Diagnosis not present

## 2020-05-01 DIAGNOSIS — H33322 Round hole, left eye: Secondary | ICD-10-CM | POA: Diagnosis not present

## 2020-05-01 DIAGNOSIS — Z961 Presence of intraocular lens: Secondary | ICD-10-CM | POA: Diagnosis not present

## 2020-05-01 DIAGNOSIS — H33312 Horseshoe tear of retina without detachment, left eye: Secondary | ICD-10-CM | POA: Diagnosis not present

## 2020-05-01 DIAGNOSIS — H33012 Retinal detachment with single break, left eye: Secondary | ICD-10-CM | POA: Diagnosis not present

## 2020-05-01 DIAGNOSIS — H35363 Drusen (degenerative) of macula, bilateral: Secondary | ICD-10-CM | POA: Diagnosis not present

## 2020-05-01 DIAGNOSIS — H35413 Lattice degeneration of retina, bilateral: Secondary | ICD-10-CM | POA: Diagnosis not present

## 2020-05-01 DIAGNOSIS — H33321 Round hole, right eye: Secondary | ICD-10-CM | POA: Diagnosis not present

## 2020-05-01 DIAGNOSIS — H353131 Nonexudative age-related macular degeneration, bilateral, early dry stage: Secondary | ICD-10-CM | POA: Diagnosis not present

## 2020-05-17 DIAGNOSIS — I2510 Atherosclerotic heart disease of native coronary artery without angina pectoris: Secondary | ICD-10-CM | POA: Diagnosis not present

## 2020-05-17 DIAGNOSIS — U071 COVID-19: Secondary | ICD-10-CM | POA: Diagnosis not present

## 2020-06-04 DIAGNOSIS — H33312 Horseshoe tear of retina without detachment, left eye: Secondary | ICD-10-CM | POA: Diagnosis not present

## 2020-06-16 DIAGNOSIS — U071 COVID-19: Secondary | ICD-10-CM | POA: Diagnosis not present

## 2020-06-16 DIAGNOSIS — I2510 Atherosclerotic heart disease of native coronary artery without angina pectoris: Secondary | ICD-10-CM | POA: Diagnosis not present

## 2020-06-25 DIAGNOSIS — M130 Polyarthritis, unspecified: Secondary | ICD-10-CM | POA: Diagnosis not present

## 2020-06-25 DIAGNOSIS — E119 Type 2 diabetes mellitus without complications: Secondary | ICD-10-CM | POA: Diagnosis not present

## 2020-06-25 DIAGNOSIS — I119 Hypertensive heart disease without heart failure: Secondary | ICD-10-CM | POA: Diagnosis not present

## 2020-06-25 DIAGNOSIS — J449 Chronic obstructive pulmonary disease, unspecified: Secondary | ICD-10-CM | POA: Diagnosis not present

## 2020-06-25 DIAGNOSIS — E1169 Type 2 diabetes mellitus with other specified complication: Secondary | ICD-10-CM | POA: Diagnosis not present

## 2020-07-02 DIAGNOSIS — H33321 Round hole, right eye: Secondary | ICD-10-CM | POA: Diagnosis not present

## 2020-07-02 DIAGNOSIS — H35411 Lattice degeneration of retina, right eye: Secondary | ICD-10-CM | POA: Diagnosis not present

## 2020-07-17 DIAGNOSIS — U071 COVID-19: Secondary | ICD-10-CM | POA: Diagnosis not present

## 2020-07-17 DIAGNOSIS — I2510 Atherosclerotic heart disease of native coronary artery without angina pectoris: Secondary | ICD-10-CM | POA: Diagnosis not present

## 2020-08-16 DIAGNOSIS — U071 COVID-19: Secondary | ICD-10-CM | POA: Diagnosis not present

## 2020-08-16 DIAGNOSIS — I2510 Atherosclerotic heart disease of native coronary artery without angina pectoris: Secondary | ICD-10-CM | POA: Diagnosis not present

## 2020-09-16 DIAGNOSIS — U071 COVID-19: Secondary | ICD-10-CM | POA: Diagnosis not present

## 2020-09-16 DIAGNOSIS — I2510 Atherosclerotic heart disease of native coronary artery without angina pectoris: Secondary | ICD-10-CM | POA: Diagnosis not present

## 2020-09-24 DIAGNOSIS — I10 Essential (primary) hypertension: Secondary | ICD-10-CM | POA: Diagnosis not present

## 2020-09-24 DIAGNOSIS — E7801 Familial hypercholesterolemia: Secondary | ICD-10-CM | POA: Diagnosis not present

## 2020-09-24 DIAGNOSIS — E789 Disorder of lipoprotein metabolism, unspecified: Secondary | ICD-10-CM | POA: Diagnosis not present

## 2020-09-24 DIAGNOSIS — R634 Abnormal weight loss: Secondary | ICD-10-CM | POA: Diagnosis not present

## 2020-09-24 DIAGNOSIS — Z0000 Encounter for general adult medical examination without abnormal findings: Secondary | ICD-10-CM | POA: Diagnosis not present

## 2020-09-24 DIAGNOSIS — I119 Hypertensive heart disease without heart failure: Secondary | ICD-10-CM | POA: Diagnosis not present

## 2020-09-24 DIAGNOSIS — M130 Polyarthritis, unspecified: Secondary | ICD-10-CM | POA: Diagnosis not present

## 2020-10-15 DIAGNOSIS — I10 Essential (primary) hypertension: Secondary | ICD-10-CM | POA: Diagnosis not present

## 2020-10-15 DIAGNOSIS — J4520 Mild intermittent asthma, uncomplicated: Secondary | ICD-10-CM | POA: Diagnosis not present

## 2020-10-15 DIAGNOSIS — E1169 Type 2 diabetes mellitus with other specified complication: Secondary | ICD-10-CM | POA: Diagnosis not present

## 2020-10-15 DIAGNOSIS — J440 Chronic obstructive pulmonary disease with acute lower respiratory infection: Secondary | ICD-10-CM | POA: Diagnosis not present

## 2020-10-17 DIAGNOSIS — I2510 Atherosclerotic heart disease of native coronary artery without angina pectoris: Secondary | ICD-10-CM | POA: Diagnosis not present

## 2020-10-17 DIAGNOSIS — U071 COVID-19: Secondary | ICD-10-CM | POA: Diagnosis not present

## 2020-10-24 DIAGNOSIS — H33002 Unspecified retinal detachment with retinal break, left eye: Secondary | ICD-10-CM | POA: Diagnosis not present

## 2020-10-24 DIAGNOSIS — H401131 Primary open-angle glaucoma, bilateral, mild stage: Secondary | ICD-10-CM | POA: Diagnosis not present

## 2020-10-29 DIAGNOSIS — H35372 Puckering of macula, left eye: Secondary | ICD-10-CM | POA: Diagnosis not present

## 2020-10-29 DIAGNOSIS — H35363 Drusen (degenerative) of macula, bilateral: Secondary | ICD-10-CM | POA: Diagnosis not present

## 2020-10-29 DIAGNOSIS — H401132 Primary open-angle glaucoma, bilateral, moderate stage: Secondary | ICD-10-CM | POA: Diagnosis not present

## 2020-10-29 DIAGNOSIS — H35412 Lattice degeneration of retina, left eye: Secondary | ICD-10-CM | POA: Diagnosis not present

## 2020-10-29 DIAGNOSIS — H33312 Horseshoe tear of retina without detachment, left eye: Secondary | ICD-10-CM | POA: Diagnosis not present

## 2020-10-29 DIAGNOSIS — H31093 Other chorioretinal scars, bilateral: Secondary | ICD-10-CM | POA: Diagnosis not present

## 2020-10-29 DIAGNOSIS — H353131 Nonexudative age-related macular degeneration, bilateral, early dry stage: Secondary | ICD-10-CM | POA: Diagnosis not present

## 2020-11-12 DIAGNOSIS — J440 Chronic obstructive pulmonary disease with acute lower respiratory infection: Secondary | ICD-10-CM | POA: Diagnosis not present

## 2020-11-12 DIAGNOSIS — J4520 Mild intermittent asthma, uncomplicated: Secondary | ICD-10-CM | POA: Diagnosis not present

## 2020-11-12 DIAGNOSIS — Z7984 Long term (current) use of oral hypoglycemic drugs: Secondary | ICD-10-CM | POA: Diagnosis not present

## 2020-11-12 DIAGNOSIS — I10 Essential (primary) hypertension: Secondary | ICD-10-CM | POA: Diagnosis not present

## 2020-11-19 DIAGNOSIS — E1169 Type 2 diabetes mellitus with other specified complication: Secondary | ICD-10-CM | POA: Diagnosis not present

## 2020-11-19 DIAGNOSIS — I119 Hypertensive heart disease without heart failure: Secondary | ICD-10-CM | POA: Diagnosis not present

## 2020-11-19 DIAGNOSIS — H33322 Round hole, left eye: Secondary | ICD-10-CM | POA: Diagnosis not present

## 2020-11-19 DIAGNOSIS — J441 Chronic obstructive pulmonary disease with (acute) exacerbation: Secondary | ICD-10-CM | POA: Diagnosis not present

## 2020-11-19 DIAGNOSIS — I10 Essential (primary) hypertension: Secondary | ICD-10-CM | POA: Diagnosis not present

## 2020-12-13 DIAGNOSIS — I10 Essential (primary) hypertension: Secondary | ICD-10-CM | POA: Diagnosis not present

## 2020-12-13 DIAGNOSIS — J440 Chronic obstructive pulmonary disease with acute lower respiratory infection: Secondary | ICD-10-CM | POA: Diagnosis not present

## 2020-12-13 DIAGNOSIS — J4520 Mild intermittent asthma, uncomplicated: Secondary | ICD-10-CM | POA: Diagnosis not present

## 2020-12-13 DIAGNOSIS — E1169 Type 2 diabetes mellitus with other specified complication: Secondary | ICD-10-CM | POA: Diagnosis not present

## 2021-01-12 DIAGNOSIS — J440 Chronic obstructive pulmonary disease with acute lower respiratory infection: Secondary | ICD-10-CM | POA: Diagnosis not present

## 2021-01-12 DIAGNOSIS — E1169 Type 2 diabetes mellitus with other specified complication: Secondary | ICD-10-CM | POA: Diagnosis not present

## 2021-01-12 DIAGNOSIS — J4520 Mild intermittent asthma, uncomplicated: Secondary | ICD-10-CM | POA: Diagnosis not present

## 2021-01-12 DIAGNOSIS — I10 Essential (primary) hypertension: Secondary | ICD-10-CM | POA: Diagnosis not present

## 2021-02-18 DIAGNOSIS — I10 Essential (primary) hypertension: Secondary | ICD-10-CM | POA: Diagnosis not present

## 2021-02-18 DIAGNOSIS — I119 Hypertensive heart disease without heart failure: Secondary | ICD-10-CM | POA: Diagnosis not present

## 2021-02-18 DIAGNOSIS — E1169 Type 2 diabetes mellitus with other specified complication: Secondary | ICD-10-CM | POA: Diagnosis not present

## 2021-02-18 DIAGNOSIS — J449 Chronic obstructive pulmonary disease, unspecified: Secondary | ICD-10-CM | POA: Diagnosis not present

## 2021-04-08 DIAGNOSIS — Z961 Presence of intraocular lens: Secondary | ICD-10-CM | POA: Diagnosis not present

## 2021-04-08 DIAGNOSIS — H353131 Nonexudative age-related macular degeneration, bilateral, early dry stage: Secondary | ICD-10-CM | POA: Diagnosis not present

## 2021-04-08 DIAGNOSIS — H35363 Drusen (degenerative) of macula, bilateral: Secondary | ICD-10-CM | POA: Diagnosis not present

## 2021-04-08 DIAGNOSIS — H401132 Primary open-angle glaucoma, bilateral, moderate stage: Secondary | ICD-10-CM | POA: Diagnosis not present

## 2021-04-08 DIAGNOSIS — H35372 Puckering of macula, left eye: Secondary | ICD-10-CM | POA: Diagnosis not present

## 2021-04-08 DIAGNOSIS — H35342 Macular cyst, hole, or pseudohole, left eye: Secondary | ICD-10-CM | POA: Diagnosis not present

## 2021-04-14 DIAGNOSIS — J4520 Mild intermittent asthma, uncomplicated: Secondary | ICD-10-CM | POA: Diagnosis not present

## 2021-04-14 DIAGNOSIS — I10 Essential (primary) hypertension: Secondary | ICD-10-CM | POA: Diagnosis not present

## 2021-04-14 DIAGNOSIS — J440 Chronic obstructive pulmonary disease with acute lower respiratory infection: Secondary | ICD-10-CM | POA: Diagnosis not present

## 2021-04-14 DIAGNOSIS — E1169 Type 2 diabetes mellitus with other specified complication: Secondary | ICD-10-CM | POA: Diagnosis not present

## 2021-04-25 DIAGNOSIS — H02834 Dermatochalasis of left upper eyelid: Secondary | ICD-10-CM | POA: Diagnosis not present

## 2021-04-25 DIAGNOSIS — E119 Type 2 diabetes mellitus without complications: Secondary | ICD-10-CM | POA: Diagnosis not present

## 2021-04-25 DIAGNOSIS — H401131 Primary open-angle glaucoma, bilateral, mild stage: Secondary | ICD-10-CM | POA: Diagnosis not present

## 2021-04-25 DIAGNOSIS — H35372 Puckering of macula, left eye: Secondary | ICD-10-CM | POA: Diagnosis not present

## 2021-04-25 DIAGNOSIS — Z961 Presence of intraocular lens: Secondary | ICD-10-CM | POA: Diagnosis not present

## 2021-04-25 DIAGNOSIS — H35412 Lattice degeneration of retina, left eye: Secondary | ICD-10-CM | POA: Diagnosis not present

## 2021-04-25 DIAGNOSIS — H33002 Unspecified retinal detachment with retinal break, left eye: Secondary | ICD-10-CM | POA: Diagnosis not present

## 2021-04-25 DIAGNOSIS — H02831 Dermatochalasis of right upper eyelid: Secondary | ICD-10-CM | POA: Diagnosis not present

## 2021-06-24 DIAGNOSIS — E1169 Type 2 diabetes mellitus with other specified complication: Secondary | ICD-10-CM | POA: Diagnosis not present

## 2021-06-24 DIAGNOSIS — R635 Abnormal weight gain: Secondary | ICD-10-CM | POA: Diagnosis not present

## 2021-06-24 DIAGNOSIS — I119 Hypertensive heart disease without heart failure: Secondary | ICD-10-CM | POA: Diagnosis not present

## 2021-06-24 DIAGNOSIS — I10 Essential (primary) hypertension: Secondary | ICD-10-CM | POA: Diagnosis not present

## 2021-06-24 DIAGNOSIS — M130 Polyarthritis, unspecified: Secondary | ICD-10-CM | POA: Diagnosis not present

## 2021-06-24 DIAGNOSIS — J4520 Mild intermittent asthma, uncomplicated: Secondary | ICD-10-CM | POA: Diagnosis not present

## 2021-07-09 DIAGNOSIS — H35413 Lattice degeneration of retina, bilateral: Secondary | ICD-10-CM | POA: Diagnosis not present

## 2021-07-09 DIAGNOSIS — H35372 Puckering of macula, left eye: Secondary | ICD-10-CM | POA: Diagnosis not present

## 2021-07-09 DIAGNOSIS — H35363 Drusen (degenerative) of macula, bilateral: Secondary | ICD-10-CM | POA: Diagnosis not present

## 2021-07-09 DIAGNOSIS — H33323 Round hole, bilateral: Secondary | ICD-10-CM | POA: Diagnosis not present

## 2021-07-09 DIAGNOSIS — H353131 Nonexudative age-related macular degeneration, bilateral, early dry stage: Secondary | ICD-10-CM | POA: Diagnosis not present

## 2021-07-09 DIAGNOSIS — H31093 Other chorioretinal scars, bilateral: Secondary | ICD-10-CM | POA: Diagnosis not present

## 2021-10-01 DIAGNOSIS — Z6841 Body Mass Index (BMI) 40.0 and over, adult: Secondary | ICD-10-CM | POA: Diagnosis not present

## 2021-10-01 DIAGNOSIS — J440 Chronic obstructive pulmonary disease with acute lower respiratory infection: Secondary | ICD-10-CM | POA: Diagnosis not present

## 2021-10-01 DIAGNOSIS — J441 Chronic obstructive pulmonary disease with (acute) exacerbation: Secondary | ICD-10-CM | POA: Diagnosis not present

## 2021-10-01 DIAGNOSIS — I119 Hypertensive heart disease without heart failure: Secondary | ICD-10-CM | POA: Diagnosis not present

## 2021-10-01 DIAGNOSIS — E1169 Type 2 diabetes mellitus with other specified complication: Secondary | ICD-10-CM | POA: Diagnosis not present

## 2021-11-25 DIAGNOSIS — I10 Essential (primary) hypertension: Secondary | ICD-10-CM | POA: Diagnosis not present

## 2021-11-25 DIAGNOSIS — I110 Hypertensive heart disease with heart failure: Secondary | ICD-10-CM | POA: Diagnosis not present

## 2021-11-25 DIAGNOSIS — J449 Chronic obstructive pulmonary disease, unspecified: Secondary | ICD-10-CM | POA: Diagnosis not present

## 2021-11-25 DIAGNOSIS — E1169 Type 2 diabetes mellitus with other specified complication: Secondary | ICD-10-CM | POA: Diagnosis not present

## 2021-11-25 DIAGNOSIS — M130 Polyarthritis, unspecified: Secondary | ICD-10-CM | POA: Diagnosis not present

## 2021-11-25 DIAGNOSIS — N390 Urinary tract infection, site not specified: Secondary | ICD-10-CM | POA: Diagnosis not present

## 2021-11-25 DIAGNOSIS — Z6841 Body Mass Index (BMI) 40.0 and over, adult: Secondary | ICD-10-CM | POA: Diagnosis not present

## 2021-12-09 DIAGNOSIS — E1169 Type 2 diabetes mellitus with other specified complication: Secondary | ICD-10-CM | POA: Diagnosis not present

## 2021-12-09 DIAGNOSIS — N390 Urinary tract infection, site not specified: Secondary | ICD-10-CM | POA: Diagnosis not present

## 2021-12-09 DIAGNOSIS — J449 Chronic obstructive pulmonary disease, unspecified: Secondary | ICD-10-CM | POA: Diagnosis not present

## 2021-12-09 DIAGNOSIS — N3281 Overactive bladder: Secondary | ICD-10-CM | POA: Diagnosis not present

## 2021-12-09 DIAGNOSIS — Z6841 Body Mass Index (BMI) 40.0 and over, adult: Secondary | ICD-10-CM | POA: Diagnosis not present

## 2021-12-09 DIAGNOSIS — I10 Essential (primary) hypertension: Secondary | ICD-10-CM | POA: Diagnosis not present

## 2021-12-24 DIAGNOSIS — H401131 Primary open-angle glaucoma, bilateral, mild stage: Secondary | ICD-10-CM | POA: Diagnosis not present

## 2022-01-06 DIAGNOSIS — H353131 Nonexudative age-related macular degeneration, bilateral, early dry stage: Secondary | ICD-10-CM | POA: Diagnosis not present

## 2022-01-06 DIAGNOSIS — H31093 Other chorioretinal scars, bilateral: Secondary | ICD-10-CM | POA: Diagnosis not present

## 2022-01-06 DIAGNOSIS — I10 Essential (primary) hypertension: Secondary | ICD-10-CM | POA: Diagnosis not present

## 2022-01-06 DIAGNOSIS — N390 Urinary tract infection, site not specified: Secondary | ICD-10-CM | POA: Diagnosis not present

## 2022-01-06 DIAGNOSIS — H35363 Drusen (degenerative) of macula, bilateral: Secondary | ICD-10-CM | POA: Diagnosis not present

## 2022-01-06 DIAGNOSIS — H401132 Primary open-angle glaucoma, bilateral, moderate stage: Secondary | ICD-10-CM | POA: Diagnosis not present

## 2022-01-06 DIAGNOSIS — H35372 Puckering of macula, left eye: Secondary | ICD-10-CM | POA: Diagnosis not present

## 2022-01-06 DIAGNOSIS — E1169 Type 2 diabetes mellitus with other specified complication: Secondary | ICD-10-CM | POA: Diagnosis not present

## 2022-01-06 DIAGNOSIS — Z961 Presence of intraocular lens: Secondary | ICD-10-CM | POA: Diagnosis not present

## 2022-01-12 DIAGNOSIS — I10 Essential (primary) hypertension: Secondary | ICD-10-CM | POA: Diagnosis not present

## 2022-01-12 DIAGNOSIS — E1169 Type 2 diabetes mellitus with other specified complication: Secondary | ICD-10-CM | POA: Diagnosis not present

## 2022-02-17 DIAGNOSIS — E1169 Type 2 diabetes mellitus with other specified complication: Secondary | ICD-10-CM | POA: Diagnosis not present

## 2022-02-17 DIAGNOSIS — E7801 Familial hypercholesterolemia: Secondary | ICD-10-CM | POA: Diagnosis not present

## 2022-02-17 DIAGNOSIS — I110 Hypertensive heart disease with heart failure: Secondary | ICD-10-CM | POA: Diagnosis not present

## 2022-02-17 DIAGNOSIS — I509 Heart failure, unspecified: Secondary | ICD-10-CM | POA: Diagnosis not present

## 2022-02-17 DIAGNOSIS — Z6841 Body Mass Index (BMI) 40.0 and over, adult: Secondary | ICD-10-CM | POA: Diagnosis not present

## 2022-02-17 DIAGNOSIS — I119 Hypertensive heart disease without heart failure: Secondary | ICD-10-CM | POA: Diagnosis not present

## 2022-02-17 DIAGNOSIS — I10 Essential (primary) hypertension: Secondary | ICD-10-CM | POA: Diagnosis not present

## 2022-03-17 DIAGNOSIS — N390 Urinary tract infection, site not specified: Secondary | ICD-10-CM | POA: Diagnosis not present

## 2022-03-17 DIAGNOSIS — J440 Chronic obstructive pulmonary disease with acute lower respiratory infection: Secondary | ICD-10-CM | POA: Diagnosis not present

## 2022-03-17 DIAGNOSIS — D649 Anemia, unspecified: Secondary | ICD-10-CM | POA: Diagnosis not present

## 2022-03-17 DIAGNOSIS — D529 Folate deficiency anemia, unspecified: Secondary | ICD-10-CM | POA: Diagnosis not present

## 2022-03-17 DIAGNOSIS — I10 Essential (primary) hypertension: Secondary | ICD-10-CM | POA: Diagnosis not present

## 2022-03-17 DIAGNOSIS — I119 Hypertensive heart disease without heart failure: Secondary | ICD-10-CM | POA: Diagnosis not present

## 2022-03-17 DIAGNOSIS — Z0000 Encounter for general adult medical examination without abnormal findings: Secondary | ICD-10-CM | POA: Diagnosis not present

## 2022-03-17 DIAGNOSIS — E6609 Other obesity due to excess calories: Secondary | ICD-10-CM | POA: Diagnosis not present

## 2022-03-17 DIAGNOSIS — M130 Polyarthritis, unspecified: Secondary | ICD-10-CM | POA: Diagnosis not present

## 2022-03-19 ENCOUNTER — Other Ambulatory Visit: Payer: Self-pay | Admitting: Family Medicine

## 2022-03-19 ENCOUNTER — Other Ambulatory Visit (HOSPITAL_COMMUNITY): Payer: Self-pay | Admitting: Family Medicine

## 2022-03-19 DIAGNOSIS — R109 Unspecified abdominal pain: Secondary | ICD-10-CM

## 2022-04-17 DIAGNOSIS — E119 Type 2 diabetes mellitus without complications: Secondary | ICD-10-CM | POA: Diagnosis not present

## 2022-04-17 DIAGNOSIS — Z6839 Body mass index (BMI) 39.0-39.9, adult: Secondary | ICD-10-CM | POA: Diagnosis not present

## 2022-04-17 DIAGNOSIS — D649 Anemia, unspecified: Secondary | ICD-10-CM | POA: Diagnosis not present

## 2022-04-17 DIAGNOSIS — R351 Nocturia: Secondary | ICD-10-CM | POA: Diagnosis not present

## 2022-04-17 DIAGNOSIS — K441 Diaphragmatic hernia with gangrene: Secondary | ICD-10-CM | POA: Diagnosis not present

## 2022-04-17 DIAGNOSIS — J441 Chronic obstructive pulmonary disease with (acute) exacerbation: Secondary | ICD-10-CM | POA: Diagnosis not present

## 2022-05-05 DIAGNOSIS — I119 Hypertensive heart disease without heart failure: Secondary | ICD-10-CM | POA: Diagnosis not present

## 2022-05-05 DIAGNOSIS — J440 Chronic obstructive pulmonary disease with acute lower respiratory infection: Secondary | ICD-10-CM | POA: Diagnosis not present

## 2022-05-05 DIAGNOSIS — E1169 Type 2 diabetes mellitus with other specified complication: Secondary | ICD-10-CM | POA: Diagnosis not present

## 2022-05-05 DIAGNOSIS — I10 Essential (primary) hypertension: Secondary | ICD-10-CM | POA: Diagnosis not present

## 2022-05-05 DIAGNOSIS — D649 Anemia, unspecified: Secondary | ICD-10-CM | POA: Diagnosis not present

## 2022-05-26 ENCOUNTER — Ambulatory Visit (HOSPITAL_COMMUNITY)
Admission: RE | Admit: 2022-05-26 | Discharge: 2022-05-26 | Disposition: A | Discharge status: Home / Self Care | Payer: PPO | Source: Ambulatory Visit | Attending: Family Medicine | Admitting: Family Medicine

## 2022-05-26 DIAGNOSIS — R109 Unspecified abdominal pain: Secondary | ICD-10-CM | POA: Diagnosis not present

## 2022-05-26 DIAGNOSIS — D3501 Benign neoplasm of right adrenal gland: Secondary | ICD-10-CM | POA: Diagnosis not present

## 2022-05-26 DIAGNOSIS — K5730 Diverticulosis of large intestine without perforation or abscess without bleeding: Secondary | ICD-10-CM | POA: Diagnosis not present

## 2022-05-26 DIAGNOSIS — K769 Liver disease, unspecified: Secondary | ICD-10-CM | POA: Diagnosis not present

## 2022-05-26 DIAGNOSIS — G8929 Other chronic pain: Secondary | ICD-10-CM | POA: Diagnosis not present

## 2022-06-02 DIAGNOSIS — B889 Infestation, unspecified: Secondary | ICD-10-CM | POA: Diagnosis not present

## 2022-06-02 DIAGNOSIS — I119 Hypertensive heart disease without heart failure: Secondary | ICD-10-CM | POA: Diagnosis not present

## 2022-06-02 DIAGNOSIS — D649 Anemia, unspecified: Secondary | ICD-10-CM | POA: Diagnosis not present

## 2022-06-24 DIAGNOSIS — H02834 Dermatochalasis of left upper eyelid: Secondary | ICD-10-CM | POA: Diagnosis not present

## 2022-06-24 DIAGNOSIS — H02831 Dermatochalasis of right upper eyelid: Secondary | ICD-10-CM | POA: Diagnosis not present

## 2022-06-24 DIAGNOSIS — H401131 Primary open-angle glaucoma, bilateral, mild stage: Secondary | ICD-10-CM | POA: Diagnosis not present

## 2022-06-24 DIAGNOSIS — H35412 Lattice degeneration of retina, left eye: Secondary | ICD-10-CM | POA: Diagnosis not present

## 2022-06-24 DIAGNOSIS — H35372 Puckering of macula, left eye: Secondary | ICD-10-CM | POA: Diagnosis not present

## 2022-06-24 DIAGNOSIS — H33002 Unspecified retinal detachment with retinal break, left eye: Secondary | ICD-10-CM | POA: Diagnosis not present

## 2022-06-24 DIAGNOSIS — Z961 Presence of intraocular lens: Secondary | ICD-10-CM | POA: Diagnosis not present

## 2022-06-24 DIAGNOSIS — E119 Type 2 diabetes mellitus without complications: Secondary | ICD-10-CM | POA: Diagnosis not present

## 2022-07-07 DIAGNOSIS — H31093 Other chorioretinal scars, bilateral: Secondary | ICD-10-CM | POA: Diagnosis not present

## 2022-07-07 DIAGNOSIS — H35372 Puckering of macula, left eye: Secondary | ICD-10-CM | POA: Diagnosis not present

## 2022-07-07 DIAGNOSIS — H35363 Drusen (degenerative) of macula, bilateral: Secondary | ICD-10-CM | POA: Diagnosis not present

## 2022-07-07 DIAGNOSIS — H401132 Primary open-angle glaucoma, bilateral, moderate stage: Secondary | ICD-10-CM | POA: Diagnosis not present

## 2022-07-07 DIAGNOSIS — Z961 Presence of intraocular lens: Secondary | ICD-10-CM | POA: Diagnosis not present

## 2022-07-07 DIAGNOSIS — H353131 Nonexudative age-related macular degeneration, bilateral, early dry stage: Secondary | ICD-10-CM | POA: Diagnosis not present

## 2022-11-21 DIAGNOSIS — R5381 Other malaise: Secondary | ICD-10-CM | POA: Diagnosis not present

## 2022-11-21 DIAGNOSIS — I119 Hypertensive heart disease without heart failure: Secondary | ICD-10-CM | POA: Diagnosis not present

## 2022-11-21 DIAGNOSIS — Z6841 Body Mass Index (BMI) 40.0 and over, adult: Secondary | ICD-10-CM | POA: Diagnosis not present

## 2022-11-21 DIAGNOSIS — E1169 Type 2 diabetes mellitus with other specified complication: Secondary | ICD-10-CM | POA: Diagnosis not present

## 2022-11-21 DIAGNOSIS — M130 Polyarthritis, unspecified: Secondary | ICD-10-CM | POA: Diagnosis not present

## 2022-11-21 DIAGNOSIS — R5383 Other fatigue: Secondary | ICD-10-CM | POA: Diagnosis not present

## 2022-11-21 DIAGNOSIS — I10 Essential (primary) hypertension: Secondary | ICD-10-CM | POA: Diagnosis not present

## 2022-11-21 DIAGNOSIS — R0600 Dyspnea, unspecified: Secondary | ICD-10-CM | POA: Diagnosis not present

## 2022-11-26 ENCOUNTER — Other Ambulatory Visit (HOSPITAL_COMMUNITY): Payer: Self-pay | Admitting: Family Medicine

## 2022-11-26 DIAGNOSIS — Z1231 Encounter for screening mammogram for malignant neoplasm of breast: Secondary | ICD-10-CM

## 2022-12-23 DIAGNOSIS — H401131 Primary open-angle glaucoma, bilateral, mild stage: Secondary | ICD-10-CM | POA: Diagnosis not present

## 2023-01-07 DIAGNOSIS — H401132 Primary open-angle glaucoma, bilateral, moderate stage: Secondary | ICD-10-CM | POA: Diagnosis not present

## 2023-01-07 DIAGNOSIS — H31093 Other chorioretinal scars, bilateral: Secondary | ICD-10-CM | POA: Diagnosis not present

## 2023-01-07 DIAGNOSIS — H35363 Drusen (degenerative) of macula, bilateral: Secondary | ICD-10-CM | POA: Diagnosis not present

## 2023-01-07 DIAGNOSIS — Z961 Presence of intraocular lens: Secondary | ICD-10-CM | POA: Diagnosis not present

## 2023-01-07 DIAGNOSIS — H353131 Nonexudative age-related macular degeneration, bilateral, early dry stage: Secondary | ICD-10-CM | POA: Diagnosis not present

## 2023-02-13 DIAGNOSIS — E7800 Pure hypercholesterolemia, unspecified: Secondary | ICD-10-CM | POA: Diagnosis not present

## 2023-02-13 DIAGNOSIS — I10 Essential (primary) hypertension: Secondary | ICD-10-CM | POA: Diagnosis not present

## 2023-02-24 DIAGNOSIS — Z207 Contact with and (suspected) exposure to pediculosis, acariasis and other infestations: Secondary | ICD-10-CM | POA: Diagnosis not present

## 2023-02-24 DIAGNOSIS — R634 Abnormal weight loss: Secondary | ICD-10-CM | POA: Diagnosis not present

## 2023-02-24 DIAGNOSIS — D530 Protein deficiency anemia: Secondary | ICD-10-CM | POA: Diagnosis not present

## 2023-02-24 DIAGNOSIS — E1169 Type 2 diabetes mellitus with other specified complication: Secondary | ICD-10-CM | POA: Diagnosis not present

## 2023-02-24 DIAGNOSIS — E7800 Pure hypercholesterolemia, unspecified: Secondary | ICD-10-CM | POA: Diagnosis not present

## 2023-02-24 DIAGNOSIS — Z6837 Body mass index (BMI) 37.0-37.9, adult: Secondary | ICD-10-CM | POA: Diagnosis not present

## 2023-03-05 ENCOUNTER — Other Ambulatory Visit (HOSPITAL_COMMUNITY): Payer: Self-pay | Admitting: Family Medicine

## 2023-03-05 DIAGNOSIS — D649 Anemia, unspecified: Secondary | ICD-10-CM

## 2023-03-05 DIAGNOSIS — R634 Abnormal weight loss: Secondary | ICD-10-CM

## 2023-03-15 DIAGNOSIS — E7800 Pure hypercholesterolemia, unspecified: Secondary | ICD-10-CM | POA: Diagnosis not present

## 2023-03-15 DIAGNOSIS — I10 Essential (primary) hypertension: Secondary | ICD-10-CM | POA: Diagnosis not present

## 2023-03-24 ENCOUNTER — Ambulatory Visit (HOSPITAL_COMMUNITY)
Admission: RE | Admit: 2023-03-24 | Discharge: 2023-03-24 | Disposition: A | Discharge status: Home / Self Care | Payer: PPO | Source: Ambulatory Visit | Attending: Family Medicine | Admitting: Family Medicine

## 2023-03-24 ENCOUNTER — Encounter (HOSPITAL_COMMUNITY): Payer: Self-pay | Admitting: Radiology

## 2023-03-24 DIAGNOSIS — I728 Aneurysm of other specified arteries: Secondary | ICD-10-CM | POA: Diagnosis not present

## 2023-03-24 DIAGNOSIS — D649 Anemia, unspecified: Secondary | ICD-10-CM | POA: Diagnosis not present

## 2023-03-24 DIAGNOSIS — R1010 Upper abdominal pain, unspecified: Secondary | ICD-10-CM | POA: Diagnosis not present

## 2023-03-24 DIAGNOSIS — R634 Abnormal weight loss: Secondary | ICD-10-CM | POA: Diagnosis not present

## 2023-03-24 DIAGNOSIS — K5750 Diverticulosis of both small and large intestine without perforation or abscess without bleeding: Secondary | ICD-10-CM | POA: Diagnosis not present

## 2023-03-24 LAB — POCT I-STAT CREATININE: Creatinine, Ser: 1.1 mg/dL — ABNORMAL HIGH (ref 0.44–1.00)

## 2023-03-24 MED ORDER — IOHEXOL 300 MG/ML  SOLN
100.0000 mL | Freq: Once | INTRAMUSCULAR | Status: AC | PRN
  Administered 2023-03-24: 80 mL via INTRAVENOUS

## 2023-04-06 DIAGNOSIS — E1169 Type 2 diabetes mellitus with other specified complication: Secondary | ICD-10-CM | POA: Diagnosis not present

## 2023-04-06 DIAGNOSIS — I119 Hypertensive heart disease without heart failure: Secondary | ICD-10-CM | POA: Diagnosis not present

## 2023-04-06 DIAGNOSIS — R634 Abnormal weight loss: Secondary | ICD-10-CM | POA: Diagnosis not present

## 2023-04-15 DIAGNOSIS — I10 Essential (primary) hypertension: Secondary | ICD-10-CM | POA: Diagnosis not present

## 2023-04-15 DIAGNOSIS — E7800 Pure hypercholesterolemia, unspecified: Secondary | ICD-10-CM | POA: Diagnosis not present

## 2023-04-18 DIAGNOSIS — E785 Hyperlipidemia, unspecified: Secondary | ICD-10-CM | POA: Diagnosis not present

## 2023-04-18 DIAGNOSIS — E1142 Type 2 diabetes mellitus with diabetic polyneuropathy: Secondary | ICD-10-CM | POA: Diagnosis not present

## 2023-04-18 DIAGNOSIS — I10 Essential (primary) hypertension: Secondary | ICD-10-CM | POA: Diagnosis not present

## 2023-04-18 DIAGNOSIS — E1169 Type 2 diabetes mellitus with other specified complication: Secondary | ICD-10-CM | POA: Diagnosis not present

## 2023-04-18 DIAGNOSIS — J4520 Mild intermittent asthma, uncomplicated: Secondary | ICD-10-CM | POA: Diagnosis not present

## 2023-04-18 DIAGNOSIS — I110 Hypertensive heart disease with heart failure: Secondary | ICD-10-CM | POA: Diagnosis not present

## 2023-04-18 DIAGNOSIS — G8929 Other chronic pain: Secondary | ICD-10-CM | POA: Diagnosis not present

## 2023-04-18 DIAGNOSIS — E261 Secondary hyperaldosteronism: Secondary | ICD-10-CM | POA: Diagnosis not present

## 2023-04-18 DIAGNOSIS — I2089 Other forms of angina pectoris: Secondary | ICD-10-CM | POA: Diagnosis not present

## 2023-04-18 DIAGNOSIS — J449 Chronic obstructive pulmonary disease, unspecified: Secondary | ICD-10-CM | POA: Diagnosis not present

## 2023-04-18 DIAGNOSIS — I509 Heart failure, unspecified: Secondary | ICD-10-CM | POA: Diagnosis not present

## 2023-05-11 DIAGNOSIS — J441 Chronic obstructive pulmonary disease with (acute) exacerbation: Secondary | ICD-10-CM | POA: Diagnosis not present

## 2023-05-11 DIAGNOSIS — E1169 Type 2 diabetes mellitus with other specified complication: Secondary | ICD-10-CM | POA: Diagnosis not present

## 2023-05-11 DIAGNOSIS — I10 Essential (primary) hypertension: Secondary | ICD-10-CM | POA: Diagnosis not present

## 2023-09-14 DIAGNOSIS — J441 Chronic obstructive pulmonary disease with (acute) exacerbation: Secondary | ICD-10-CM | POA: Diagnosis not present

## 2023-09-14 DIAGNOSIS — E1169 Type 2 diabetes mellitus with other specified complication: Secondary | ICD-10-CM | POA: Diagnosis not present

## 2023-09-14 DIAGNOSIS — Z6836 Body mass index (BMI) 36.0-36.9, adult: Secondary | ICD-10-CM | POA: Diagnosis not present

## 2023-09-14 DIAGNOSIS — F4381 Prolonged grief disorder: Secondary | ICD-10-CM | POA: Diagnosis not present

## 2023-09-14 DIAGNOSIS — I10 Essential (primary) hypertension: Secondary | ICD-10-CM | POA: Diagnosis not present

## 2023-09-14 DIAGNOSIS — Z0000 Encounter for general adult medical examination without abnormal findings: Secondary | ICD-10-CM | POA: Diagnosis not present

## 2024-01-11 DIAGNOSIS — D649 Anemia, unspecified: Secondary | ICD-10-CM | POA: Diagnosis not present

## 2024-01-11 DIAGNOSIS — E1169 Type 2 diabetes mellitus with other specified complication: Secondary | ICD-10-CM | POA: Diagnosis not present

## 2024-01-11 DIAGNOSIS — E119 Type 2 diabetes mellitus without complications: Secondary | ICD-10-CM | POA: Diagnosis not present

## 2024-01-11 DIAGNOSIS — Z6836 Body mass index (BMI) 36.0-36.9, adult: Secondary | ICD-10-CM | POA: Diagnosis not present

## 2024-01-11 DIAGNOSIS — J441 Chronic obstructive pulmonary disease with (acute) exacerbation: Secondary | ICD-10-CM | POA: Diagnosis not present

## 2024-01-11 DIAGNOSIS — I10 Essential (primary) hypertension: Secondary | ICD-10-CM | POA: Diagnosis not present

## 2024-01-26 DIAGNOSIS — M130 Polyarthritis, unspecified: Secondary | ICD-10-CM | POA: Diagnosis not present

## 2024-01-26 DIAGNOSIS — D649 Anemia, unspecified: Secondary | ICD-10-CM | POA: Diagnosis not present

## 2024-01-26 DIAGNOSIS — I10 Essential (primary) hypertension: Secondary | ICD-10-CM | POA: Diagnosis not present

## 2024-01-26 DIAGNOSIS — R0600 Dyspnea, unspecified: Secondary | ICD-10-CM | POA: Diagnosis not present

## 2024-03-08 DIAGNOSIS — R0600 Dyspnea, unspecified: Secondary | ICD-10-CM | POA: Diagnosis not present

## 2024-03-08 DIAGNOSIS — D649 Anemia, unspecified: Secondary | ICD-10-CM | POA: Diagnosis not present

## 2024-03-08 DIAGNOSIS — I119 Hypertensive heart disease without heart failure: Secondary | ICD-10-CM | POA: Diagnosis not present

## 2024-04-05 DIAGNOSIS — I119 Hypertensive heart disease without heart failure: Secondary | ICD-10-CM | POA: Diagnosis not present

## 2024-04-05 DIAGNOSIS — I10 Essential (primary) hypertension: Secondary | ICD-10-CM | POA: Diagnosis not present

## 2024-04-05 DIAGNOSIS — E1169 Type 2 diabetes mellitus with other specified complication: Secondary | ICD-10-CM | POA: Diagnosis not present

## 2024-04-05 DIAGNOSIS — E1165 Type 2 diabetes mellitus with hyperglycemia: Secondary | ICD-10-CM | POA: Diagnosis not present

## 2024-04-05 DIAGNOSIS — I509 Heart failure, unspecified: Secondary | ICD-10-CM | POA: Diagnosis not present

## 2024-04-05 DIAGNOSIS — J449 Chronic obstructive pulmonary disease, unspecified: Secondary | ICD-10-CM | POA: Diagnosis not present

## 2024-04-05 DIAGNOSIS — D649 Anemia, unspecified: Secondary | ICD-10-CM | POA: Diagnosis not present

## 2024-04-05 DIAGNOSIS — Z6834 Body mass index (BMI) 34.0-34.9, adult: Secondary | ICD-10-CM | POA: Diagnosis not present

## 2024-05-24 DIAGNOSIS — Z6832 Body mass index (BMI) 32.0-32.9, adult: Secondary | ICD-10-CM | POA: Diagnosis not present

## 2024-05-24 DIAGNOSIS — M130 Polyarthritis, unspecified: Secondary | ICD-10-CM | POA: Diagnosis not present

## 2024-05-24 DIAGNOSIS — I10 Essential (primary) hypertension: Secondary | ICD-10-CM | POA: Diagnosis not present

## 2024-05-24 DIAGNOSIS — D449 Neoplasm of uncertain behavior of unspecified endocrine gland: Secondary | ICD-10-CM | POA: Diagnosis not present

## 2024-05-24 DIAGNOSIS — J440 Chronic obstructive pulmonary disease with acute lower respiratory infection: Secondary | ICD-10-CM | POA: Diagnosis not present

## 2024-05-26 ENCOUNTER — Other Ambulatory Visit (HOSPITAL_COMMUNITY): Payer: Self-pay | Admitting: Family Medicine

## 2024-05-26 DIAGNOSIS — J449 Chronic obstructive pulmonary disease, unspecified: Secondary | ICD-10-CM

## 2024-05-26 DIAGNOSIS — I509 Heart failure, unspecified: Secondary | ICD-10-CM

## 2024-06-13 ENCOUNTER — Ambulatory Visit: Attending: Cardiology

## 2024-06-13 DIAGNOSIS — J449 Chronic obstructive pulmonary disease, unspecified: Secondary | ICD-10-CM | POA: Diagnosis not present

## 2024-06-13 DIAGNOSIS — I509 Heart failure, unspecified: Secondary | ICD-10-CM | POA: Diagnosis not present

## 2024-06-15 LAB — ECHOCARDIOGRAM COMPLETE
AR max vel: 1.66 cm2
AV Area VTI: 1.8 cm2
AV Area mean vel: 1.6 cm2
AV Mean grad: 7 mmHg
AV Peak grad: 11.8 mmHg
Ao pk vel: 1.72 m/s
Area-P 1/2: 2.28 cm2
Calc EF: 62.3 %
MV VTI: 2.13 cm2
S' Lateral: 2.2 cm
Single Plane A2C EF: 71 %
Single Plane A4C EF: 50.9 %

## 2024-07-05 DIAGNOSIS — R5383 Other fatigue: Secondary | ICD-10-CM | POA: Diagnosis not present

## 2024-07-05 DIAGNOSIS — D649 Anemia, unspecified: Secondary | ICD-10-CM | POA: Diagnosis not present

## 2024-07-05 DIAGNOSIS — I119 Hypertensive heart disease without heart failure: Secondary | ICD-10-CM | POA: Diagnosis not present

## 2024-07-05 DIAGNOSIS — J441 Chronic obstructive pulmonary disease with (acute) exacerbation: Secondary | ICD-10-CM | POA: Diagnosis not present

## 2024-07-05 DIAGNOSIS — E119 Type 2 diabetes mellitus without complications: Secondary | ICD-10-CM | POA: Diagnosis not present

## 2024-07-05 DIAGNOSIS — R634 Abnormal weight loss: Secondary | ICD-10-CM | POA: Diagnosis not present
